# Patient Record
Sex: Female | Born: 1980 | Race: White | Hispanic: No | Marital: Married | Smoking: Former smoker
Health system: Southern US, Community
[De-identification: ages and names within clinical notes are randomized; demographics above are authoritative.]

## PROBLEM LIST (undated history)

## (undated) DIAGNOSIS — J40 Bronchitis, not specified as acute or chronic: Secondary | ICD-10-CM

## (undated) DIAGNOSIS — F329 Major depressive disorder, single episode, unspecified: Secondary | ICD-10-CM

## (undated) DIAGNOSIS — T7840XA Allergy, unspecified, initial encounter: Secondary | ICD-10-CM

## (undated) DIAGNOSIS — R011 Cardiac murmur, unspecified: Secondary | ICD-10-CM

## (undated) DIAGNOSIS — Z8639 Personal history of other endocrine, nutritional and metabolic disease: Secondary | ICD-10-CM

## (undated) DIAGNOSIS — F32A Depression, unspecified: Secondary | ICD-10-CM

## (undated) DIAGNOSIS — F419 Anxiety disorder, unspecified: Secondary | ICD-10-CM

## (undated) DIAGNOSIS — J189 Pneumonia, unspecified organism: Secondary | ICD-10-CM

## (undated) DIAGNOSIS — E041 Nontoxic single thyroid nodule: Secondary | ICD-10-CM

## (undated) HISTORY — DX: Allergy, unspecified, initial encounter: T78.40XA

## (undated) HISTORY — DX: Anxiety disorder, unspecified: F41.9

## (undated) HISTORY — DX: Depression, unspecified: F32.A

## (undated) HISTORY — DX: Major depressive disorder, single episode, unspecified: F32.9

## (undated) HISTORY — DX: Personal history of other endocrine, nutritional and metabolic disease: Z86.39

---

## 2005-10-28 ENCOUNTER — Other Ambulatory Visit: Admission: RE | Admit: 2005-10-28 | Discharge: 2005-10-28 | Payer: Self-pay | Admitting: Gynecology

## 2006-12-03 ENCOUNTER — Other Ambulatory Visit: Admission: RE | Admit: 2006-12-03 | Discharge: 2006-12-03 | Payer: Self-pay | Admitting: Gynecology

## 2007-10-12 ENCOUNTER — Ambulatory Visit (HOSPITAL_COMMUNITY): Admission: RE | Admit: 2007-10-12 | Discharge: 2007-10-12 | Payer: Self-pay | Admitting: Internal Medicine

## 2008-12-23 ENCOUNTER — Inpatient Hospital Stay (HOSPITAL_COMMUNITY): Admission: AD | Admit: 2008-12-23 | Discharge: 2008-12-25 | Payer: Self-pay | Admitting: Obstetrics and Gynecology

## 2008-12-26 ENCOUNTER — Encounter: Admission: RE | Admit: 2008-12-26 | Discharge: 2009-01-25 | Payer: Self-pay | Admitting: Obstetrics and Gynecology

## 2009-01-26 ENCOUNTER — Encounter: Admission: RE | Admit: 2009-01-26 | Discharge: 2009-02-24 | Payer: Self-pay | Admitting: Obstetrics and Gynecology

## 2009-03-20 ENCOUNTER — Emergency Department (HOSPITAL_COMMUNITY): Admission: EM | Admit: 2009-03-20 | Discharge: 2009-03-20 | Payer: Self-pay | Admitting: Emergency Medicine

## 2011-01-17 LAB — CBC
Hemoglobin: 12.6 g/dL (ref 12.0–15.0)
MCHC: 34.1 g/dL (ref 30.0–36.0)
MCV: 92.1 fL (ref 78.0–100.0)
MCV: 92.1 fL (ref 78.0–100.0)
Platelets: 162 10*3/uL (ref 150–400)
RDW: 13.1 % (ref 11.5–15.5)
RDW: 13.1 % (ref 11.5–15.5)
WBC: 13.5 10*3/uL — ABNORMAL HIGH (ref 4.0–10.5)

## 2011-01-17 LAB — RPR: RPR Ser Ql: NONREACTIVE

## 2011-02-19 NOTE — Discharge Summary (Signed)
Jennifer Ponce, Jennifer Ponce             ACCOUNT NO.:  [id number]   MEDICAL RECORD NO.:  [id number]          PATIENT TYPE:  INP   LOCATION:  9146                          FACILITY:  [facility]   PHYSICIAN:  Sherron Monday, MD        DATE OF BIRTH:  Aug 21, 1981   DATE OF ADMISSION:  12/23/2008  DATE OF DISCHARGE:  12/25/2008                               DISCHARGE SUMMARY   ADMITTING DIAGNOSIS:  Intrauterine pregnancy at term for induction of  labor.   DISCHARGE DIAGNOSIS:  Intrauterine pregnancy at term for induction of  labor, delivered via vacuum-assisted vaginal delivery.   For a complete history and physical, please refer to the dictated note.  However, in brief, a 30 year old G1, P0 at 53 plus weeks' pregnancy for  induction of labor given postdates.  She has had an uncomplicated  prenatal course.  She was admitted to Labor and Delivery on December 23, 2008.  Four hours after her penicillin was first dose, she had her  membranes ruptured for clear fluid and she also had Pitocin  augmentation.  She progressed to complete-complete +2, pushed for  approximately 20 minutes to deliver a viable female infant at 60 with  Apgars of 8 at one minute and 9 at five minutes and a weight of 7 pounds  13 ounces.  Placenta was expressed intact at 1813.  She had a first-  degree perineal that was somewhat stellate as well as bilateral labia  that were repaired with 3-0 Vicryl in a typical fashion.  The patient  delivered with vacuum assistance secondary to prolonged decels.  After  discussion of risks, benefits, and alternatives, and bladder was  sterilely drained, we proceeded with 4 pulls and no popoffs.  Her  postpartum course was relatively uncomplicated.  She remained afebrile  with stable vital signs throughout.  She tolerated p.o. and was  ambulating well.  She did have some numbness in her anterior thighs.  This was discussed with Anesthesia.  We will continue to monitor.  If  she continues to have  problems, we will discuss it further in her  postpartum course.  Her hemoglobin decreased from 12.6 to 10.5.  She is  discharged to home on postpartum day #2 with routine discharge  instructions and numbers to call if any questions or problems as well as  prescriptions for Motrin, Vicodin, and prenatal vitamins.  She will  follow up in 6 weeks.   DISCHARGE INFORMATION:  Hemoglobin 12.6 to 10.5.  O positive.  Rubella  equivocal.  We will start contraception at her postpartum checkup,  possibly an IUD versus Micronor.  She plans to breast-feed.  She voices  understanding to all these instructions and will return for her visit.      Sherron Monday, MD  Electronically Signed     JB/MEDQ  D:  12/25/2008  T:  12/26/2008  Job:  [id number]

## 2011-02-19 NOTE — H&P (Signed)
Jennifer Ponce, AUGUST             ACCOUNT NO.:  [id number]   MEDICAL RECORD NO.:  [id number]          PATIENT TYPE:  MAT   LOCATION:  [facility]                          [facility]ACILITY:  [facility]   PHYSICIAN:  Sherron Monday, MD        DATE OF BIRTH:  1981/06/06   DATE OF ADMISSION:  12/23/2008  DATE OF DISCHARGE:                              HISTORY & PHYSICAL   ADMISSION DIAGNOSIS:  Intrauterine pregnancy at term.   PROCEDURE PLANNED:  Induction of labor.   HPI:  A 29 year old G1, P0 at 40+ weeks pregnancy for induction of labor  given post dates.  She states she has had good fetal movement.  No loss  of fluid, no vaginal bleeding, and occasional contractions.  Her  prenatal care has been relatively uncomplicated.   PAST MEDICAL HISTORY:  Not significant.  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   PAST SURGICAL HISTORY:  Nonsignificant.   PAST OB/GYN HISTORY:  G1 is the current pregnancy.  No history of any  abnormal Pap smears or any sexually transmitted diseases.   MEDICATIONS:  Include prenatal vitamins.   ALLERGIES:  NO KNOWN DRUG ALLERGIES.   SOCIAL HISTORY:  Patient denies alcohol, tobacco, or drug use in the  pregnancy.  She does have a history of smoking but quit with the  pregnancy.   FAMILY HISTORY:  Patient denies coronary artery disease, hypertension,  diabetes, or cancer.   PHYSICAL EXAM:  GENERAL:  She is in no apparent distress.  CARDIOVASCULAR:  Regular rate and rhythm.  LUNGS:  Clear to auscultation bilaterally.  ABDOMEN:  Soft.  Fundus nontender.  EXTREMITIES:  Symmetric and nontender.   In the office, fetal heart tones were 140's and cervical exam was 2 to 3  cm dilated, 50% effaced, and -2 station with a vertex presentation.   PRENATAL LABS:  Hemoglobin 12.5, platelets 244,000.  RPR nonreactive.  Hepatitis B surface antigen negative.  Rubella equivocal.  HIV  nonreactive.  Urinalysis  negative.  Pap smear had no endocervical cells,  however, was otherwise within normal limits with a negative HPV.  Blood  type was O+.  Antibody screen negative.  Gonorrhea negative.  Chlamydia  negative.  Patient denied first and second trimester screening or cystic  fibrosis.  Anatomy ultrasound within normal limits.  Glucola of 82.  Group B strep was positive.  She did receive her seasonal flu shot, as  well as her H1N1 vaccine.  On ultrasound, she had an anterior placenta.   ASSESSMENT AND PLAN:  A 30 year old G1, P0 at 40+ weeks for induction of  labor given post dates.  She will be admitted and penicillin will be  dosed 4 hours after the penicillin.  Her water will be broke.  We expect  to have a vaginal delivery.  Discussed with her risks, benefits, and  alternatives of induction of labor.  She voices understanding to all  this.  We will proceed on the December 23, 2008.      Sherron Monday, MD  Electronically  Signed     JB/MEDQ  D:  12/22/2008  T:  12/22/2008  Job:  [id number]

## 2012-04-02 ENCOUNTER — Other Ambulatory Visit (HOSPITAL_COMMUNITY): Payer: Self-pay | Admitting: Internal Medicine

## 2012-04-02 ENCOUNTER — Ambulatory Visit (HOSPITAL_COMMUNITY)
Admission: RE | Admit: 2012-04-02 | Discharge: 2012-04-02 | Disposition: A | Discharge status: Home / Self Care | Payer: BC Managed Care – HMO | Source: Ambulatory Visit | Attending: Internal Medicine | Admitting: Internal Medicine

## 2012-04-02 DIAGNOSIS — R059 Cough, unspecified: Secondary | ICD-10-CM | POA: Insufficient documentation

## 2012-04-02 DIAGNOSIS — R0602 Shortness of breath: Secondary | ICD-10-CM | POA: Insufficient documentation

## 2012-04-02 DIAGNOSIS — J40 Bronchitis, not specified as acute or chronic: Secondary | ICD-10-CM | POA: Insufficient documentation

## 2012-04-02 DIAGNOSIS — R05 Cough: Secondary | ICD-10-CM | POA: Insufficient documentation

## 2013-06-08 ENCOUNTER — Other Ambulatory Visit (HOSPITAL_COMMUNITY): Payer: Self-pay | Admitting: Internal Medicine

## 2013-06-08 ENCOUNTER — Ambulatory Visit (HOSPITAL_COMMUNITY)
Admission: RE | Admit: 2013-06-08 | Discharge: 2013-06-08 | Disposition: A | Discharge status: Home / Self Care | Payer: BC Managed Care – HMO | Source: Ambulatory Visit | Attending: Internal Medicine | Admitting: Internal Medicine

## 2013-06-08 DIAGNOSIS — R0602 Shortness of breath: Secondary | ICD-10-CM

## 2013-06-15 ENCOUNTER — Institutional Professional Consult (permissible substitution): Payer: BC Managed Care – HMO | Admitting: Internal Medicine

## 2013-07-05 IMAGING — CR DG CHEST 2V
2 series · 2 of 2 positions shown · non-contrast
Comparison: 03/20/2009

CLINICAL DATA: Cough, bronchitis

CHEST - 2 VIEW

[view not recorded (1 of 2)]
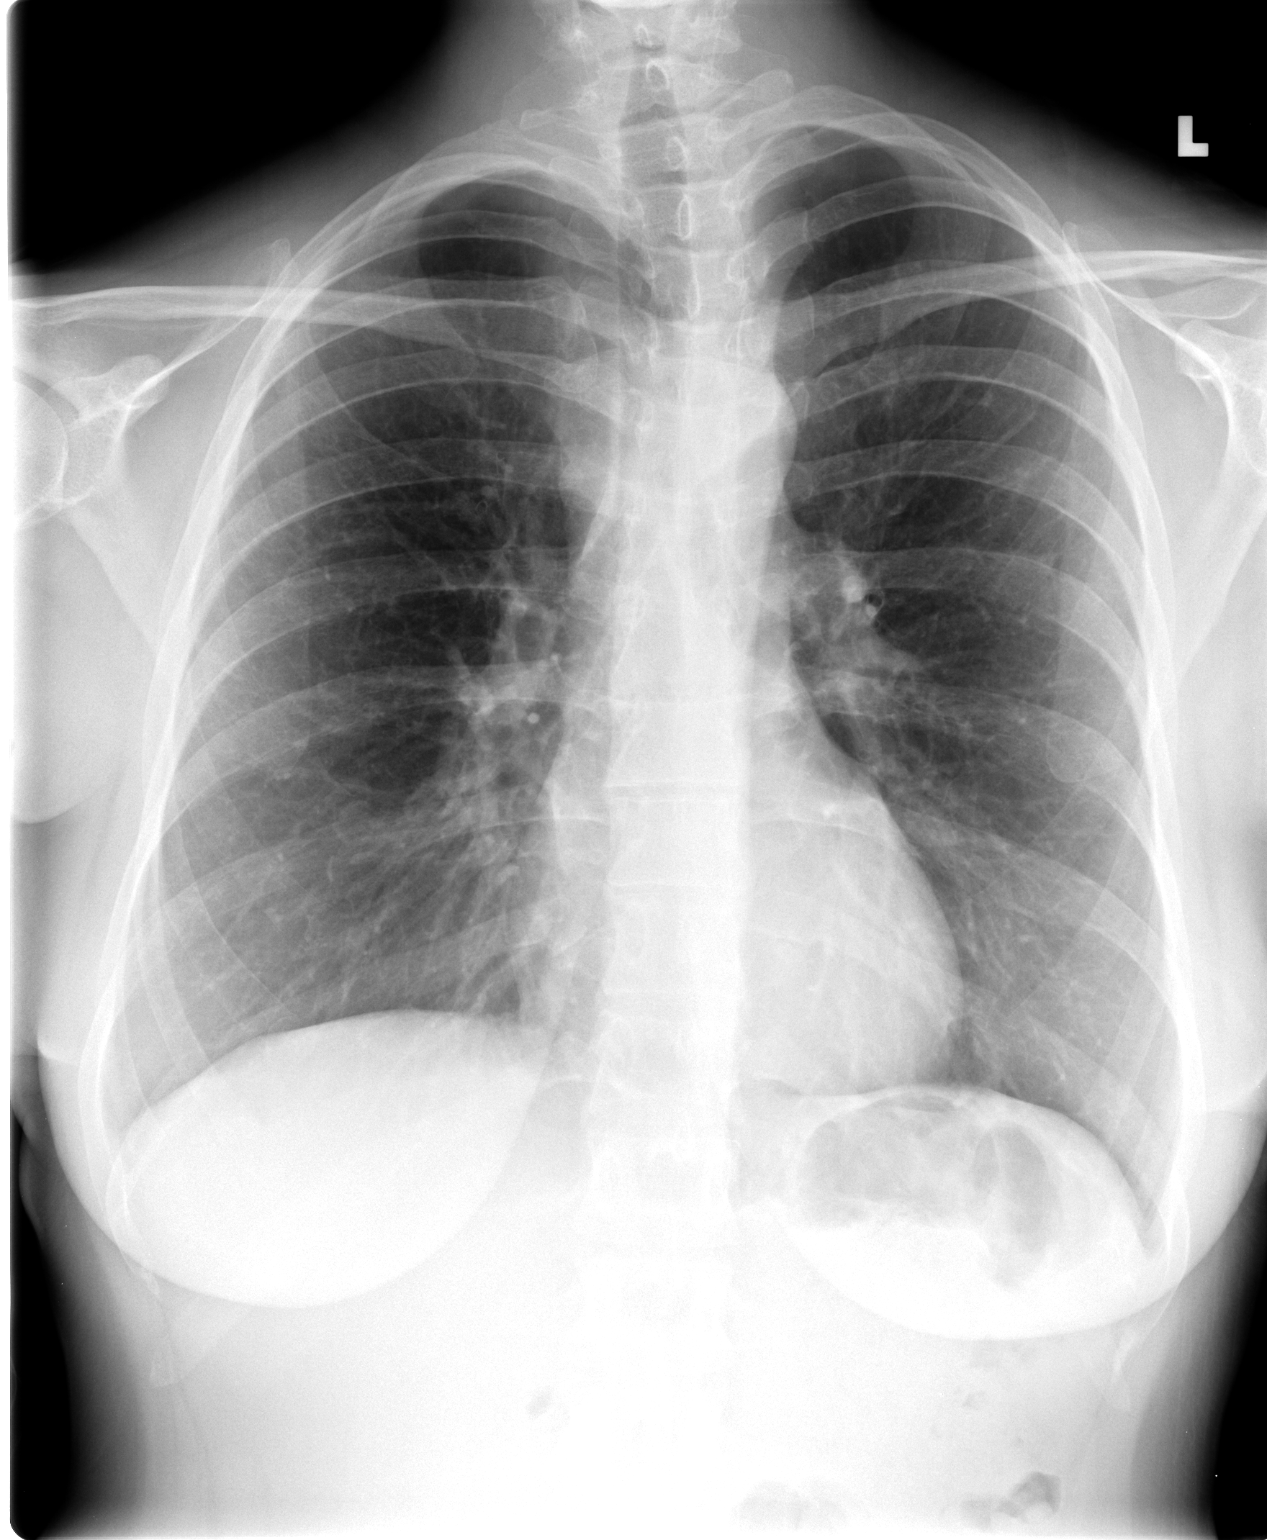

[view not recorded (2 of 2)]
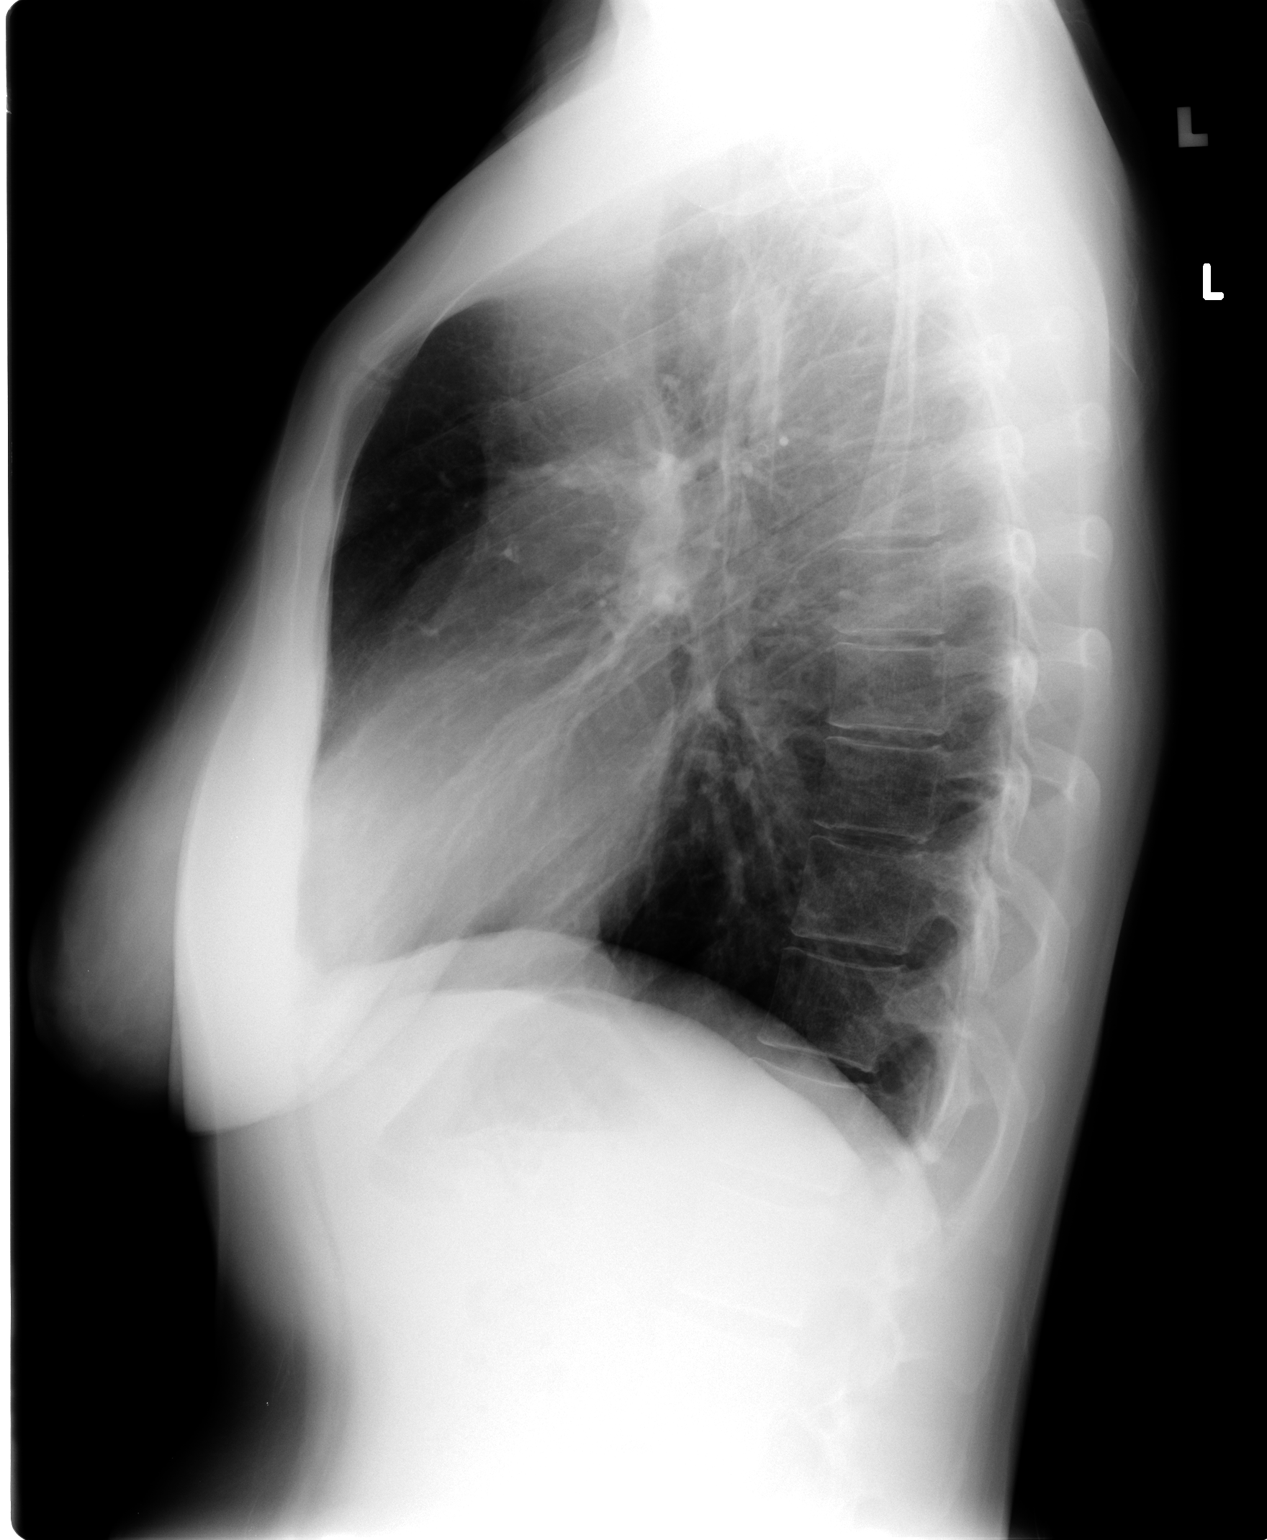

[2 of 2 positions shown; findings below may reference images not displayed]

FINDINGS: The heart size and mediastinal contours are within
normal limits.  Both lungs are clear.  The visualized skeletal
structures are unremarkable.
IMPRESSION: No active cardiopulmonary disease.

## 2013-07-26 ENCOUNTER — Ambulatory Visit (INDEPENDENT_AMBULATORY_CARE_PROVIDER_SITE_OTHER): Payer: BC Managed Care – HMO | Admitting: Internal Medicine

## 2013-07-26 ENCOUNTER — Encounter: Payer: Self-pay | Admitting: Internal Medicine

## 2013-07-26 VITALS — BP 112/76 | HR 71 | Ht 67.0 in | Wt 166.4 lb

## 2013-07-26 DIAGNOSIS — R0600 Dyspnea, unspecified: Secondary | ICD-10-CM

## 2013-07-26 DIAGNOSIS — J45909 Unspecified asthma, uncomplicated: Secondary | ICD-10-CM | POA: Insufficient documentation

## 2013-07-26 DIAGNOSIS — E041 Nontoxic single thyroid nodule: Secondary | ICD-10-CM

## 2013-07-26 DIAGNOSIS — R0609 Other forms of dyspnea: Secondary | ICD-10-CM

## 2013-07-26 MED ORDER — FLUTICASONE FUROATE-VILANTEROL 100-25 MCG/INH IN AEPB: 1.0000 | INHALATION_SPRAY | Freq: Every day | RESPIRATORY_TRACT | Status: DC

## 2013-07-26 NOTE — Assessment & Plan Note (Signed)
Compatible with mild asthma, but not clear if she responded to trial of Advair and prednisone. We will try again with a maintenance controller scheduling PFTs. Her symptoms are supple, and seen in pulmonary rather than muscle weakness. In the differential consider cardiomyopathy and anemia. Doubt PE.

## 2013-07-26 NOTE — Progress Notes (Signed)
07/26/13- 25 yoF former smoker referred courtesy of Dr Kathryne Sharper ; SOB started few months ago; harder in hotter weather. She had smoked one pack per day for 8 years, ending 6 years ago. Has been aware over the last 2 months of occasional difficulty taking a deep breath. At the onset she felt some knee cough congestion from her throat. At first dyspnea was intermittent but it became gradually more persistent. Worse with heat, humidity, exercise like brisk walking. No chest pain, fluid retention or palpitation. Not aware of postnasal drip or reflux. Generally comfortable lying down but may have had to sit up occasionally to get some relief at the very beginning. She gives a history of intermittent bronchitis and 2 episodes of walking pneumonia. Several sinus infections. Tends to wheeze with infection. Dr Kathryne Sharper office gave a trial of Advair, Singulair, prednisone taper in September. Not clear, that has helped. She denies family history of lung disease personal history of DVT, heart disease or significant anemia. She denies being pregnant.  Prior to Admission medications   Medication Sig Start Date End Date Taking? Authorizing Provider  aspirin 81 MG tablet Take 81 mg by mouth daily.   Yes Historical Provider, MD  Cholecalciferol (VITAMIN D3) 5000 UNITS CAPS Take 1 capsule by mouth daily.   Yes Historical Provider, MD  FLUoxetine (PROZAC) 20 MG capsule Take 20 mg by mouth daily.   Yes Historical Provider, MD  levonorgestrel (MIRENA) 20 MCG/24HR IUD 1 each by Intrauterine route once.   Yes Historical Provider, MD  Fluticasone Furoate-Vilanterol (BREO ELLIPTA) 100-25 MCG/INH AEPB Inhale 1 puff into the lungs daily. 07/26/13   Waymon Budge, MD   History reviewed. No pertinent past medical history. History reviewed. No pertinent past surgical history. Family History  Problem Relation Age of Onset  . Heart disease Maternal Grandmother   . Brain cancer Maternal Grandfather   . Breast cancer Maternal  Aunt    History   Social History  . Marital Status: Married    Spouse Name: N/A    Number of Children: 1  . Years of Education: N/A   Occupational History  . Airline pilot    Social History Main Topics  . Smoking status: Former Smoker -- 8 years    Types: Cigarettes    Quit date: 07/27/2007  . Smokeless tobacco: Not on file  . Alcohol Use: Yes     Comment: occasionally  . Drug Use: No  . Sexual Activity: Not on file   Other Topics Concern  . Not on file   Social History Narrative  . No narrative on file   ROS-see HPI Constitutional:   No-   weight loss, night sweats, fevers, chills, fatigue, lassitude. HEENT:   No-  headaches, difficulty swallowing, tooth/dental problems, sore throat,       No-  sneezing, itching, ear ache, nasal congestion, post nasal drip,  CV:  No-   chest pain, orthopnea, PND, swelling in lower extremities, anasarca, dizziness, palpitations Resp: +shortness of breath with exertion or at rest.              No-   productive cough,  No non-productive cough,  No- coughing up of blood.              No-   change in color of mucus.  No- wheezing.   Skin: No-   rash or lesions. GI:  No-   heartburn, indigestion, abdominal pain, nausea, vomiting, diarrhea,  change in bowel habits, loss of appetite GU: No-   dysuria, change in color of urine, no urgency or frequency.  No- flank pain. MS:  No-   joint pain or swelling.  No- decreased range of motion.  No- back pain. Neuro-     nothing unusual Psych:  No- change in mood or affect. No depression or anxiety.  No memory loss.  OBJ- Physical Exam General- Alert, Oriented, Affect-appropriate, Distress- none acute Skin- rash-none, lesions- none, excoriation- none Lymphadenopathy- none Head- atraumatic            Eyes- Gross vision intact, PERRLA, conjunctivae and secretions clear            Ears- Hearing, canals-normal            Nose- Clear, no-Septal dev, mucus, polyps, erosion,  perforation             Throat- Mallampati II , mucosa clear , drainage- none, tonsils- atrophic Neck- flexible , trachea midline, no stridor , thyroid+ question L thyroid nodule, carotid no bruit Chest - symmetrical excursion , unlabored           Heart/CV- RRR , no murmur , no gallop  , no rub, nl s1 s2                           - JVD- none , edema- none, stasis changes- none, varices- none           Lung- clear to P&A, wheeze- none, cough- none , dullness-none, rub- none           Chest wall-  Abd- tender-no, distended-no, bowel sounds-present, HSM- no Br/ Gen/ Rectal- Not done, not indicated Extrem- cyanosis- none, clubbing, none, atrophy- none, strength- nl Neuro- grossly intact to observation    CXR 06/08/13 FINDINGS:  The heart size and mediastinal contours are within normal limits.  Both lungs are clear. The visualized skeletal structures are  unremarkable.  IMPRESSION:  No active cardiopulmonary disease.  Electronically Signed  By: Myles Rosenthal  On: 06/08/2013 12:06

## 2013-07-26 NOTE — Patient Instructions (Addendum)
Sample x 2 Breo Ellipta  1 puff then rinse mouth, once daily- maintenance inhaler  Order- schedule PFT    Dx asthma  Please have Dr Kathryne Sharper office check your thyroid   Please call here after you finish the Breo samples, to let us know how you have done.

## 2013-07-26 NOTE — Assessment & Plan Note (Signed)
She will check this with her primary physician

## 2013-08-02 ENCOUNTER — Other Ambulatory Visit: Payer: Self-pay | Admitting: Internal Medicine

## 2013-08-02 DIAGNOSIS — R220 Localized swelling, mass and lump, head: Secondary | ICD-10-CM

## 2013-08-04 ENCOUNTER — Ambulatory Visit
Admission: RE | Admit: 2013-08-04 | Discharge: 2013-08-04 | Disposition: A | Discharge status: Home / Self Care | Payer: BC Managed Care – PPO | Source: Ambulatory Visit | Attending: Internal Medicine | Admitting: Internal Medicine

## 2013-08-04 DIAGNOSIS — R220 Localized swelling, mass and lump, head: Secondary | ICD-10-CM

## 2013-08-06 ENCOUNTER — Ambulatory Visit (INDEPENDENT_AMBULATORY_CARE_PROVIDER_SITE_OTHER): Payer: BC Managed Care – PPO | Admitting: Internal Medicine

## 2013-08-06 DIAGNOSIS — J45909 Unspecified asthma, uncomplicated: Secondary | ICD-10-CM

## 2013-08-06 LAB — PULMONARY FUNCTION TEST

## 2013-08-06 NOTE — Progress Notes (Signed)
PFT done today. 

## 2013-08-09 ENCOUNTER — Other Ambulatory Visit: Payer: Self-pay | Admitting: Internal Medicine

## 2013-08-09 DIAGNOSIS — E041 Nontoxic single thyroid nodule: Secondary | ICD-10-CM

## 2013-08-12 ENCOUNTER — Telehealth: Payer: Self-pay | Admitting: Internal Medicine

## 2013-08-12 NOTE — Telephone Encounter (Signed)
tammy called from [facility] to inform us on up coming appt for Jennifer Ponce sch on nov 12 at 3;45 for a thyroid biposy. Just wanted to let you know.

## 2013-08-12 NOTE — Telephone Encounter (Signed)
Pls make sure patient aware of appointment.

## 2013-08-13 ENCOUNTER — Encounter: Payer: Self-pay | Admitting: Internal Medicine

## 2013-08-16 ENCOUNTER — Ambulatory Visit: Payer: BC Managed Care – PPO | Admitting: Physician Assistant

## 2013-08-16 ENCOUNTER — Encounter: Payer: Self-pay | Admitting: Physician Assistant

## 2013-08-16 VITALS — BP 112/64 | HR 84 | Temp 98.1°F | Resp 16 | Ht 66.5 in | Wt 166.0 lb

## 2013-08-16 DIAGNOSIS — Z0000 Encounter for general adult medical examination without abnormal findings: Secondary | ICD-10-CM

## 2013-08-16 NOTE — Patient Instructions (Signed)

## 2013-08-16 NOTE — Progress Notes (Signed)
Complete Physical HPI Patient presents for complete physical.   Anxiety/depression on Prozac and states it is well controlled.  Vitamin D def on medications.  Patient had a new lump on her neck and has had U/S that shows a complex left thyroid cyst that she is getting a biopsy on Wednesday.  She has also had new shortness of breath which she has seen Dr. Maple Hudson. Has started in August, worse with heat, activity. Was intermittent but now everyday. Feels she can't get a deep breath. Before the breathing problems started she had a cough and her husband notices that she clears her throat.  Current Medications:    Medication List       This list is accurate as of: 08/16/13  2:35 PM.  Always use your most recent med list.               aspirin 81 MG tablet  Take 81 mg by mouth daily.     FLUoxetine 20 MG capsule  Commonly known as:  PROZAC  Take 20 mg by mouth daily.     Fluticasone Furoate-Vilanterol 100-25 MCG/INH Aepb  Commonly known as:  BREO ELLIPTA  Inhale 1 puff into the lungs daily.     levonorgestrel 20 MCG/24HR IUD  Commonly known as:  MIRENA  1 each by Intrauterine route once.     Vitamin D3 5000 UNITS Caps  Take 1 capsule by mouth daily.       Health Maintainance:  Health Maintenance  Topic Date Due  . Influenza Vaccine  05/07/2013  . Pap Smear  02/10/2014  . Tetanus/tdap  08/14/2015    Allergies:  Allergies  Allergen Reactions  . Albuterol     jitters   Medical History:  Past Medical History  Diagnosis Date  . Allergy   . Depression   . Anxiety    Surgical History: No past surgical history on file. Family History:  Family History  Problem Relation Age of Onset  . Heart disease Maternal Grandmother   . Brain cancer Maternal Grandfather   . Breast cancer Maternal Aunt    Social History:  History  Substance Use Topics  . Smoking status: Former Smoker -- 10 years    Types: Cigarettes    Quit date: 07/27/2007  . Smokeless tobacco: Never Used   . Alcohol Use: Yes     Comment: occasionally   ROS Constitutional: Denies weight loss/gain, headaches, insomnia, fatigue, night sweats, and change in appetite. Eyes: Denies redness, blurred vision, diplopia, discharge, itchy, watery eyes.  ENT: + thyroid mass Denies discharge, congestion, post nasal drip, sore throat, earache, hearing loss, dental pain, Tinnitus, Vertigo, Sinus pain, snoring.  Cardio: Denies chest pain, palpitations, irregular heartbeat, dyspnea, diaphoresis, orthopnea, PND, claudication, edema Respiratory:  See HPI denies cough, pleurisy, hoarseness, wheezing.  Gastrointestinal: Denies dysphagia, heartburn, pain, cramps, nausea, vomiting, bloating, diarrhea, constipation, hematemesis, melena, hematochezia, hemorrhoids Genitourinary: Denies dysuria, frequency, urgency, nocturia, hesitancy, discharge, hematuria, flank pain Breast:Breast lumps, nipple discharge, bleeding.  Musculoskeletal: Denies arthralgia, myalgia, stiffness, Jt. Swelling, pain, Skin: Denies puritis, rash, hives,  acne, eczema, changing in skin lesion Neuro: Weakness, tremor, incoordination, spasms, paresthesia, pain Psychiatric: Denies confusion, memory loss, sensory loss Endocrine: Denies change in weight, skin, hair change, nocturia, and paresthesia, Diabetic Polys, visual blurring, hyper /hypo glycemic episodes.  Heme/Lymph: Excessive bleeding, bruising, enlarged lymph nodes  Physical Exam: Estimated body mass index is 26.39 kg/(m^2) as calculated from the following:   Height as of this encounter: 5' 6.5" (1.689 m).  Weight as of this encounter: 166 lb (75.297 kg). Filed Vitals:   08/16/13 1420  BP: 112/64  Pulse: 84  Temp: 98.1 F (36.7 C)  Resp: 16   General Appearance: Well nourished, in no apparent distress. Eyes: PERRLA, EOMs, conjunctiva no swelling or erythema, normal fundi and vessels. Sinuses: No Frontal/maxillary tenderness ENT/Mouth: Ext aud canals clear, normal light reflex with  TMs without erythema, bulging.  Good dentition. No erythema, swelling, or exudate on post pharynx. Tonsils not swollen or erythematous. Hearing normal.  Neck: Supple, + thyroid mass on left lower lobe. No bruits Respiratory: Respiratory effort normal, BS equal bilaterally without rales, rhonci, wheezing or stridor. Cardio: Heart sounds normal, regular rate and rhythm without murmurs, rubs or gallops. Peripheral pulses brisk and equal bilaterally, without edema.  Chest: symmetric, with normal excursions and percussion. Breasts: defer OB/GYN Abdomen: Flat, soft, with bowl sounds. + epigastric tenderness, no guarding, rebound, hernias, masses, or organomegaly. .  Lymphatics: Non tender without + anterior cervical left lymphadenopathy approx. 1x1cm  Genitourinary: defer Musculoskeletal: Full ROM all peripheral extremities,5/5 strength, and normal gait. Skin: Warm, dry without rashes, lesions, ecchymosis.  Neuro: Cranial nerves intact, reflexes equal bilaterally. Normal muscle tone, no cerebellar symptoms. Sensation intact.  Pysch: Awake and oriented X 3, normal affect, Insight and Judgment appropriate.   EKG:  Normal  Assessment and Plan: SOB- EKG normal- with epigastric tenderness likely more GERD/asthma induced GERD. Will treat 2 weeks PPI and get Hpylori. And give handout on GERD diet.  Thyroid nodule/mass- check TSH, get BX Wednesday.  Depression/anxiety- continue prozac- in remission.  Vitamin D- continue medication General health maintainence     Quentin Mulling 2:35 PM

## 2013-08-17 ENCOUNTER — Telehealth: Payer: Self-pay | Admitting: Internal Medicine

## 2013-08-17 LAB — URINALYSIS, ROUTINE W REFLEX MICROSCOPIC
Bilirubin Urine: NEGATIVE
Glucose, UA: NEGATIVE mg/dL
Specific Gravity, Urine: 1.025 (ref 1.005–1.030)
pH: 6.5 (ref 5.0–8.0)

## 2013-08-17 LAB — LIPID PANEL
Cholesterol: 125 mg/dL (ref 0–200)
LDL Cholesterol: 57 mg/dL (ref 0–99)
Total CHOL/HDL Ratio: 2.3 Ratio
Triglycerides: 67 mg/dL (ref <150)
VLDL: 13 mg/dL (ref 0–40)

## 2013-08-17 LAB — MICROALBUMIN / CREATININE URINE RATIO
Creatinine, Urine: 207.1 mg/dL
Microalb Creat Ratio: 11.3 mg/g (ref 0.0–30.0)
Microalb, Ur: 2.33 mg/dL — ABNORMAL HIGH (ref 0.00–1.89)

## 2013-08-17 LAB — HEMOGLOBIN A1C: Mean Plasma Glucose: 105 mg/dL (ref <117)

## 2013-08-17 NOTE — Telephone Encounter (Signed)
Pt is requesting PFT results. Please advise. Carron Curie, CMA

## 2013-08-18 ENCOUNTER — Ambulatory Visit
Admission: RE | Admit: 2013-08-18 | Discharge: 2013-08-18 | Disposition: A | Discharge status: Home / Self Care | Payer: BC Managed Care – PPO | Source: Ambulatory Visit | Attending: Internal Medicine | Admitting: Internal Medicine

## 2013-08-18 ENCOUNTER — Other Ambulatory Visit (HOSPITAL_COMMUNITY)
Admission: RE | Admit: 2013-08-18 | Discharge: 2013-08-18 | Disposition: A | Discharge status: Home / Self Care | Payer: BC Managed Care – PPO | Source: Ambulatory Visit | Attending: Interventional Radiology | Admitting: Interventional Radiology

## 2013-08-18 DIAGNOSIS — E041 Nontoxic single thyroid nodule: Secondary | ICD-10-CM

## 2013-08-18 NOTE — Telephone Encounter (Signed)
Spoke with pt.  Informed her of below PFT results per CY.  She verbalized understanding Pt states the Quentin Mulling, [place] with Dr. Kathryne Sharper office took her off of the [facility] because she has some other things going on.  Reports, when she was on the [place], she didn't notice much difference.  She would like to know if she needs to do anything further at this time.  Dr. Maple Hudson, pls advise.  Thank you.

## 2013-08-18 NOTE — Telephone Encounter (Signed)
Ok to watch and see how she does off Breo.

## 2013-08-18 NOTE — Telephone Encounter (Signed)
lmomtcb x1 for pt 

## 2013-08-18 NOTE — Telephone Encounter (Signed)
Pulmonary function test scores were within normal ranges, with some further improvement after the bronchodilator medicine

## 2013-08-18 NOTE — Telephone Encounter (Signed)
LMTCB

## 2013-08-20 LAB — HELICOBACTER PYLORI ABS-IGG+IGA, BLD
H Pylori IgG: <0.4 {ISR}
HELICOBACTER PYLORI AB, IGA: 7.8 U/mL (ref <9.0)

## 2013-08-20 NOTE — Telephone Encounter (Signed)
Pt advised. Jennifer Castillo, CMA  

## 2013-08-23 ENCOUNTER — Other Ambulatory Visit: Payer: Self-pay | Admitting: Physician Assistant

## 2013-08-23 DIAGNOSIS — E041 Nontoxic single thyroid nodule: Secondary | ICD-10-CM

## 2013-09-06 ENCOUNTER — Ambulatory Visit (INDEPENDENT_AMBULATORY_CARE_PROVIDER_SITE_OTHER): Payer: BC Managed Care – PPO | Admitting: Internal Medicine

## 2013-09-06 ENCOUNTER — Encounter (INDEPENDENT_AMBULATORY_CARE_PROVIDER_SITE_OTHER): Payer: Self-pay

## 2013-09-06 ENCOUNTER — Encounter: Payer: Self-pay | Admitting: Internal Medicine

## 2013-09-06 VITALS — BP 110/66 | HR 78 | Ht 67.0 in | Wt 168.0 lb

## 2013-09-06 DIAGNOSIS — R0600 Dyspnea, unspecified: Secondary | ICD-10-CM

## 2013-09-06 DIAGNOSIS — R0609 Other forms of dyspnea: Secondary | ICD-10-CM

## 2013-09-06 MED ORDER — ALBUTEROL SULFATE HFA 108 (90 BASE) MCG/ACT IN AERS: 2.0000 | INHALATION_SPRAY | Freq: Four times a day (QID) | RESPIRATORY_TRACT | Status: DC | PRN

## 2013-09-06 NOTE — Patient Instructions (Addendum)
Script for rescue inhaler albuterol HFA bronchodilator to use if needed, 2 puffs up to 4 times daily, IF NEEDED  Please call as needed

## 2013-09-06 NOTE — Progress Notes (Signed)
07/26/13- 26 yoF former smoker referred courtesy of Dr Kathryne Sharper ; SOB started few months ago; harder in hotter weather. She had smoked one pack per day for 8 years, ending 6 years ago. Has been aware over the last 2 months of occasional difficulty taking a deep breath. At the onset she felt some knee cough congestion from her throat. At first dyspnea was intermittent but it became gradually more persistent. Worse with heat, humidity, exercise like brisk walking. No chest pain, fluid retention or palpitation. Not aware of postnasal drip or reflux. Generally comfortable lying down but may have had to sit up occasionally to get some relief at the very beginning. She gives a history of intermittent bronchitis and 2 episodes of walking pneumonia. Several sinus infections. Tends to wheeze with infection. Dr Kathryne Sharper office gave a trial of Advair, Singulair, prednisone taper in September. Not clear, that has helped. She denies family history of lung disease personal history of DVT, heart disease or significant anemia. She denies being pregnant.  09/06/13- 75 yoF former smoker followed for asthma, dyspnea, recurrent sinusitis, bronchitis FOLLOWS FOR: breathing is about the same; pt aware that PFT results were normal. Knot/lump still on neck-not cancer-will see general surgeon. Had thyroid bx. Shortness of breath is worse in warm weather or when she is very active. Denies cough or wheeze, swelling or leg pain. Has used a sample [employer]. CXR 06/08/13 IMPRESSION:  No active cardiopulmonary disease.  Electronically Signed  By: Myles Rosenthal  On: 06/08/2013 12:06 PFT: 08/06/2013-within normal limits. FEV1 4.03/121%, FEV1/FVC 0.85, FEF 25-75% 4.38/126% with a 27% response to bronchodilator. TLC 107%, DLCO 107%.  ROS-see HPI Constitutional:   No-   weight loss, night sweats, fevers, chills, fatigue, lassitude. HEENT:   No-  headaches, difficulty swallowing, tooth/dental problems, sore throat,       No-   sneezing, itching, ear ache, nasal congestion, post nasal drip,  CV:  No-   chest pain, orthopnea, PND, swelling in lower extremities, anasarca, dizziness, palpitations Resp: +shortness of breath with exertion or at rest.              No-   productive cough,  No non-productive cough,  No- coughing up of blood.              No-   change in color of mucus.  No- wheezing.   Skin: No-   rash or lesions. GI:  No-   heartburn, indigestion, abdominal pain, nausea, vomiting,  GU:  MS:  No-   joint pain or swelling.   Neuro-     nothing unusual Psych:  No- change in mood or affect. No depression or anxiety.  No memory loss.  OBJ- Physical Exam General- Alert, Oriented, Affect-appropriate, Distress- none acute Skin- rash-none, lesions- none, excoriation- none Lymphadenopathy- none Head- atraumatic            Eyes- Gross vision intact, PERRLA, conjunctivae and secretions clear            Ears- Hearing, canals-normal            Nose- Clear, no-Septal dev, mucus, polyps, erosion, perforation             Throat- Mallampati II , mucosa clear , drainage- none, tonsils- atrophic Neck- flexible , trachea midline, no stridor , + left anterior neck mass moves with thyroid, carotid no bruit Chest - symmetrical excursion , unlabored           Heart/CV- RRR , no murmur , no  gallop  , no rub, nl s1 s2                           - JVD- none , edema- none, stasis changes- none, varices- none           Lung- clear to P&A, wheeze- none, cough- none , dullness-none, rub- none, unlabored           Chest wall-  Abd-  Br/ Gen/ Rectal- Not done, not indicated Extrem- cyanosis- none, clubbing, none, atrophy- none, strength- nl Neuro- grossly intact to observation

## 2013-09-17 ENCOUNTER — Encounter (INDEPENDENT_AMBULATORY_CARE_PROVIDER_SITE_OTHER): Payer: Self-pay

## 2013-09-17 ENCOUNTER — Ambulatory Visit (INDEPENDENT_AMBULATORY_CARE_PROVIDER_SITE_OTHER): Payer: BC Managed Care – PPO | Admitting: Surgery

## 2013-09-17 ENCOUNTER — Encounter (INDEPENDENT_AMBULATORY_CARE_PROVIDER_SITE_OTHER): Payer: Self-pay | Admitting: Surgery

## 2013-09-17 VITALS — BP 120/80 | HR 78 | Resp 12 | Ht 66.0 in | Wt 168.2 lb

## 2013-09-17 DIAGNOSIS — E041 Nontoxic single thyroid nodule: Secondary | ICD-10-CM

## 2013-09-17 NOTE — Progress Notes (Signed)
Chief Complaint:  Left thyroid nodule  History of Present Illness:  Jennifer Ponce is an 32 y.o. female whose history may have started last spring when she went on Prozac. In the summer she began having shortness of breath and a perception of unable to get a good breath in he seemed winded. This led to a workup by Fannie Knee which did not show any evidence of pulmonary compromise. As a part of his workup he found a left thyroid nodule that was pretty large. Since then she has had this aspirated and this is a benign cytology. I reviewed the ultrasound and is slightly complex cystic and solid mass. On physical exam it is readily palpable on the left side was not hard or fixed.  I think she would be reasonable for her awoke come off the Prozac and see if that made any difference in her sensation of breathing. One of the serious consequences of Prozac his pulmonary fibrosis although I'm not suggesting that that is what is going on. I don't think as her thyroid is given at this feeling but think that he should be observed and if it doesn't get smaller or certainly gets bigger it may be some thing that needs to be removed with a left thyroid lobectomy. I have given her the information regarding left thyroid lobectomy.    Past Medical History  Diagnosis Date  . Allergy   . Depression   . Anxiety     No past surgical history on file.  Current Outpatient Prescriptions  Medication Sig Dispense Refill  . albuterol (PROVENTIL HFA;VENTOLIN HFA) 108 (90 BASE) MCG/ACT inhaler Inhale 2 puffs into the lungs every 6 (six) hours as needed for wheezing or shortness of breath.  1 Inhaler  6  . aspirin 81 MG tablet Take 81 mg by mouth daily.      . Cholecalciferol (VITAMIN D3) 5000 UNITS CAPS Take 1 capsule by mouth daily.      Marland Kitchen FLUoxetine (PROZAC) 20 MG capsule Take 20 mg by mouth daily.      Marland Kitchen levonorgestrel (MIRENA) 20 MCG/24HR IUD 1 each by Intrauterine route once.       No current facility-administered  medications for this visit.   Albuterol Family History  Problem Relation Age of Onset  . Heart disease Maternal Grandmother   . Brain cancer Maternal Grandfather   . Breast cancer Maternal Aunt    Social History:   reports that she quit smoking about 6 years ago. Her smoking use included Cigarettes. She smoked 0.00 packs per day for 10 years. She has never used smokeless tobacco. She reports that she drinks alcohol. She reports that she does not use illicit drugs.   REVIEW OF SYSTEMS - PERTINENT POSITIVES ONLY: As above  Physical Exam:   Blood pressure 120/80, pulse 78, resp. rate 12, height 5\' 6"  (1.676 m), weight 168 lb 3.2 oz (76.295 kg). Body mass index is 27.16 kg/(m^2).  Gen:  WDWN WF NAD  Neurological: Alert and oriented to person, place, and time. Motor and sensory function is grossly intact  Head: Normocephalic and atraumatic.  Eyes: Conjunctivae are normal. Pupils are equal, round, and reactive to light. No scleral icterus.  Neck: Normal range of motion. Neck supple. No tracheal deviation there is an easily palpable left thyroid mass that is soft, not fixed.  No adenopathy Cardiovascular:  SR without murmurs or gallops.  No carotid bruits Respiratory: Effort normal.  No respiratory distress. No chest wall tenderness. Breath sounds normal.  No wheezes, rales or rhonchi.  Abdomen:  nontender GU: Musculoskeletal: Normal range of motion. Extremities are nontender. No cyanosis, edema or clubbing noted Lymphadenopathy: No cervical, preauricular, postauricular or axillary adenopathy is present Skin: Skin is warm and dry. No rash noted. No diaphoresis. No erythema. No pallor. Pscyh: Normal mood and affect. Behavior is normal. Judgment and thought content normal.   LABORATORY RESULTS: No results found for this or any previous visit (from the past 48 hour(s)).  RADIOLOGY RESULTS: No results found.  Problem List: Patient Active Problem List   Diagnosis Date Noted  . Dyspnea  07/26/2013  . Left thyroid nodule 07/26/2013    Assessment & Plan: Left thyroid mass.  Will observe over the next 2 months.  Suggest stopping Prozac.   Return 2 months.     Matt B. Daphine Deutscher, MD, [facility] [phone] beeper [phone]  09/17/2013 5:26 PM

## 2013-09-17 NOTE — Patient Instructions (Signed)
Thyroidectomy °Thyroidectomy is the removal of part or all of your thyroid gland. Your thyroid gland is a butterfly-shaped gland at the base of your neck. It produces a substance called thyroid hormone, which regulates the physical and chemical processes that keep your body functioning and make energy available to your body (metabolism). °The amount of thyroid gland tissue that is removed during a thyroidectomy depends on the reason for the procedure. Typically, if only a part of your gland is removed, enough thyroid gland tissue remains to maintain normal function. If your entire thyroid gland is removed or if the amount of thyroid gland tissue remaining is inadequate to maintain normal function, you will need life-long treatment with thyroid hormone on a daily basis. °Thyroidectomy maybe performed when you have the following conditions: °· Thyroid nodules. These are small, abnormal collections of tissue that form inside the thyroid gland. If these nodules begin to enlarge at a rapid rate, a sample of tissue from the nodule is taken through a needle and examined (needle biopsy). This is done to determine if the nodules are cancerous. Depending on the outcome of this exam, thyroidectomy may be necessary. °· Thyroid cancer. °· Goiter, which is an enlarged thyroid gland. All or part of the thyroid gland may be removed if the gland has become so large that it causes difficulty breathing or swallowing. °· Hyperthyroidism. This is when the thyroid gland produces too much thyroid hormone. Hypothyroidism can cause symptoms of fluctuating weight, intolerance to heat, irritability, shortness of breath, and chest pain. °LET YOUR CAREGIVER KNOW ABOUT:  °· Allergies to food or medicine. °· Medicines that you are taking, including vitamins, herbs, eyedrops, over-the-counter medicines, and creams. °· Previous problems you have had with anesthetics or numbing medicines. °· History of bleeding problems or blood clots. °· Previous  surgeries you have had. °· Other health problems, including diabetes and kidney problems, you have had. °· Possibility of pregnancy, if this applies. °BEFORE THE PROCEDURE  °· Do not eat or drink anything, including water, for at least 6 hours before the procedure. °· Ask your caregiver whether you should stop taking certain medicines before the day of the procedure. °PROCEDURE  °There are different ways that thyroidectomy is performed. For each type, you will be given a medicine to make you sleep (general anesthetic). The three main types of thyroidectomy are listed as follows: °· Conventional thyroidectomy A cut (incision) in the center portion of your lower neck is made with a scalpel. Muscles below your skin are separated to gain access to your thyroid gland. Your thyroid gland is dissected from your windpipe (trachea). Often a drain is placed at the incision site to drain any blood that accumulates under the skin after the procedure. This drain will be removed before you go home. The wound from the incision should heal within 2 weeks. °· Endoscopic thyroidectomy Small incisions are made in your lower neck. A small instrument (endoscope) is inserted under your skin at the incision sites. The endoscope used for thyroidectomy consists of 2 flexible tubes. Inside one of the tubes is a video camera that is used to guide the surgeon. Tools to remove the thyroid gland, including a tool to cut the gland (dissectors) and a suction device, are inserted through the other tube. The surgeon uses the dissectors to dissect the thyroid gland from the trachea and remove it. °· Robotic thyroidectomy This procedure allows your thyroid gland to be removed through incisions in your armpit, your chest, or high in your   neck. Instruments similar to endoscopes provide a 3-dimensional picture of the surgical site. Dissecting instruments are controlled by devices similar to joysticks. These devices allow more accurate manipulation of the  instruments. After the blood supply to the gland is removed, the gland is cut into several pieces and removed through the incisions. °RISKS AND COMPLICATIONS °Complications associated with thyroidectomy are rare, but they can occur. Possible complications include: °· A decrease in parathyroid hormone levels (hypoparathyroidism) Your parathyroid glands are located close behind your thyroid gland. They are responsible for maintaining calcium levels inthe body. If they are damaged or removed, levels of calcium in the blood become low and nerves become irritable, which can cause muscle spasms. Medicines are available to treat this. °· Bacterial infection This can often be treated with medicines that kill bacteria (antibiotics). °· Damage to your voice box nerves This could cause hoarseness or complete loss of voice. °· Bleeding or airway obstruction. °AFTER THE PROCEDURE  °· You will rest in the recovery room as you wake up. °· When you first wake up, your throat may feel slightly sore. °· You will not be allowed to eat or drink until instructed otherwise. °· You will be taken to your hospital room. You will usually stay at the hospital for 1 or 2 nights. °· If a drain is placed during the procedure, it usually is removed the next day. °· You may have some mild neck pain. °· Your voice may be weak. This usually is temporary. °Document Released: 03/19/2001 Document Revised: 01/18/2013 Document Reviewed: 12/26/2010 °Exit[employer]® Patient Information ©2014 [employer]. ° °

## 2013-09-22 ENCOUNTER — Encounter: Payer: Self-pay | Admitting: Internal Medicine

## 2013-09-22 NOTE — Assessment & Plan Note (Addendum)
PFT: 08/06/2013-within normal limits. FEV1 4.03/121%, FEV1/FVC 0.85, FEF 25-75% 4.38/126% with a 27% response to bronchodilator. TLC 107%, DLCO 107%. Clear chest x-ray and normal exam. Baseline normal small airway flows did respond some to bronchodilator so we can let her work with a bronchodilator to assess symptom response. This may be best managed as a mild intermittent asthma without complication.

## 2013-10-26 ENCOUNTER — Encounter (INDEPENDENT_AMBULATORY_CARE_PROVIDER_SITE_OTHER): Payer: Self-pay | Admitting: Surgery

## 2013-11-26 ENCOUNTER — Encounter: Payer: Self-pay | Admitting: Emergency Medicine

## 2013-11-26 ENCOUNTER — Ambulatory Visit (INDEPENDENT_AMBULATORY_CARE_PROVIDER_SITE_OTHER): Payer: BC Managed Care – PPO | Admitting: Emergency Medicine

## 2013-11-26 VITALS — BP 104/62 | HR 72 | Temp 98.2°F | Resp 18 | Ht 66.5 in | Wt 168.0 lb

## 2013-11-26 DIAGNOSIS — R05 Cough: Secondary | ICD-10-CM

## 2013-11-26 DIAGNOSIS — R059 Cough, unspecified: Secondary | ICD-10-CM

## 2013-11-26 DIAGNOSIS — J309 Allergic rhinitis, unspecified: Secondary | ICD-10-CM

## 2013-11-26 DIAGNOSIS — E041 Nontoxic single thyroid nodule: Secondary | ICD-10-CM

## 2013-11-26 MED ORDER — AZITHROMYCIN 250 MG PO TABS: ORAL_TABLET | ORAL | Status: AC

## 2013-11-26 MED ORDER — BENZONATATE 100 MG PO CAPS: 100.0000 mg | ORAL_CAPSULE | Freq: Three times a day (TID) | ORAL | Status: DC | PRN

## 2013-11-26 MED ORDER — PREDNISONE 10 MG PO TABS: ORAL_TABLET | ORAL | Status: DC

## 2013-11-26 NOTE — Progress Notes (Signed)
Subjective:    Patient ID: Jennifer Ponce, female    DOB: Jan 17, 1981, 33 y.o.   MRN: [id number]  HPI Comments: 33 yo female with increasing allergy drainage for over 1 week and now with productive cough x  1 day. Production dark thick brown. Nasal congestion clear. She notes both ears feel full. She notes throat was sore but has improved. She has not tried any OTC  She has HX of thyroid nodule with BX benign. She is concerned because still has obvious visible fullness in neck on the left and occasional feels difficulty with breathing in upper airway.   Sinusitis Associated symptoms include congestion, coughing and ear pain.  Sore Throat  Associated symptoms include congestion, coughing and ear pain.  Otalgia  Associated symptoms include coughing.   Current Outpatient Prescriptions on File Prior to Visit  Medication Sig Dispense Refill  . albuterol (PROVENTIL HFA;VENTOLIN HFA) 108 (90 BASE) MCG/ACT inhaler Inhale 2 puffs into the lungs every 6 (six) hours as needed for wheezing or shortness of breath.  1 Inhaler  6  . aspirin 81 MG tablet Take 81 mg by mouth daily.      . Cholecalciferol (VITAMIN D3) 5000 UNITS CAPS Take 1 capsule by mouth daily.      Marland Kitchen. FLUoxetine (PROZAC) 20 MG capsule Take 20 mg by mouth daily.      Marland Kitchen. levonorgestrel (MIRENA) 20 MCG/24HR IUD 1 each by Intrauterine route once.       No current facility-administered medications on file prior to visit.   Allergies  Allergen Reactions  . Albuterol     jitters   Past Medical History  Diagnosis Date  . Allergy   . Depression   . Anxiety       Review of Systems  HENT: Positive for congestion and ear pain.   Respiratory: Positive for cough.   All other systems reviewed and are negative.   BP 104/62  Pulse 72  Temp(Src) 98.2 F (36.8 C) (Temporal)  Resp 18  Ht 5' 6.5" (1.689 m)  Wt 168 lb (76.204 kg)  BMI 26.71 kg/m2  LMP 11/05/2013     Objective:   Physical Exam  Nursing note and vitals  reviewed. Constitutional: She is oriented to person, place, and time. She appears well-developed and well-nourished. No distress.  HENT:  Head: Normocephalic and atraumatic.  Right Ear: External ear normal.  Left Ear: External ear normal.  Nose: Nose normal.  Mouth/Throat: Oropharynx is clear and moist. No oropharyngeal exudate.  Eyes: Conjunctivae and EOM are normal.  Neck: Normal range of motion. Neck supple. No JVD present. Thyromegaly present.  Left anterior fullness visible.   Cardiovascular: Normal rate, regular rhythm, normal heart sounds and intact distal pulses.   Pulmonary/Chest: Effort normal.  Tight/ congested cough   Abdominal: Soft. Bowel sounds are normal. She exhibits no distension and no mass. There is no tenderness. There is no rebound and no guarding.  Musculoskeletal: Normal range of motion. She exhibits no edema and no tenderness.  Lymphadenopathy:    She has no cervical adenopathy.  Neurological: She is alert and oriented to person, place, and time. No cranial nerve deficit.  Skin: Skin is warm and dry. No rash noted. No erythema. No pallor.  Psychiatric: She has a normal mood and affect. Her behavior is normal. Judgment and thought content normal.          Assessment & Plan:  1. Cough/ Allergic rhinitis- Allegra OTC, increase H2o, allergy hygiene explained.Tessalon perles 100mg   AD, Use Albuterol inhaler BID, Pred Dp 10 mG AD, Zpak if symptoms increase 2. Thyroid nodule HX and Anterior fullness- Ref Endocrine for Evaluation

## 2013-11-26 NOTE — Patient Instructions (Signed)
FYI Pneumonia, Adult Pneumonia is an infection of the lungs. It may be caused by a germ (virus or bacteria). Some types of pneumonia can spread easily from person to person. This can happen when you cough or sneeze. HOME CARE  Only take medicine as told by your doctor.  Take your medicine (antibiotics) as told. Finish it even if you start to feel better.  Do not smoke.  You may use a vaporizer or humidifier in your room. This can help loosen thick spit (mucus).  Sleep so you are almost sitting up (semi-upright). This helps reduce coughing.  Rest. A shot (vaccine) can help prevent pneumonia. Shots are often advised for:  People over 35 years old.  Patients on chemotherapy.  People with long-term (chronic) lung problems.  People with immune system problems. GET HELP RIGHT AWAY IF:   You are getting worse.  You cannot control your cough, and you are losing sleep.  You cough up blood.  Your pain gets worse, even with medicine.  You have a fever.  Any of your problems are getting worse, not better.  You have shortness of breath or chest pain. MAKE SURE YOU:   Understand these instructions.  Will watch your condition.  Will get help right away if you are not doing well or get worse. Document Released: 03/11/2008 Document Revised: 12/16/2011 Document Reviewed: 12/14/2010 [facility] Patient Information 2014 [place], [place]. Allergic Rhinitis Add Allegra and Flonase Allergic rhinitis is when the mucous membranes in the nose respond to allergens. Allergens are particles in the air that cause your body to have an allergic reaction. This causes you to release allergic antibodies. Through a chain of events, these eventually cause you to release histamine into the blood stream. Although meant to protect the body, it is this release of histamine that causes your discomfort, such as frequent sneezing, congestion, and an itchy, runny nose.  CAUSES  Seasonal allergic rhinitis (hay  fever) is caused by pollen allergens that may come from grasses, trees, and weeds. Year-round allergic rhinitis (perennial allergic rhinitis) is caused by allergens such as house dust mites, pet dander, and mold spores.  SYMPTOMS   Nasal stuffiness (congestion).  Itchy, runny nose with sneezing and tearing of the eyes. DIAGNOSIS  Your health care provider can help you determine the allergen or allergens that trigger your symptoms. If you and your health care provider are unable to determine the allergen, skin or blood testing may be used. TREATMENT  Allergic Rhinitis does not have a cure, but it can be controlled by:  Medicines and allergy shots (immunotherapy).  Avoiding the allergen. Hay fever may often be treated with antihistamines in pill or nasal spray forms. Antihistamines block the effects of histamine. There are over-the-counter medicines that may help with nasal congestion and swelling around the eyes. Check with your health care provider before taking or giving this medicine.  If avoiding the allergen or the medicine prescribed do not work, there are many new medicines your health care provider can prescribe. Stronger medicine may be used if initial measures are ineffective. Desensitizing injections can be used if medicine and avoidance does not work. Desensitization is when a patient is given ongoing shots until the body becomes less sensitive to the allergen. Make sure you follow up with your health care provider if problems continue. HOME CARE INSTRUCTIONS It is not possible to completely avoid allergens, but you can reduce your symptoms by taking steps to limit your exposure to them. It helps to know exactly  what you are allergic to so that you can avoid your specific triggers. SEEK MEDICAL CARE IF:   You have a fever.  You develop a cough that does not stop easily (persistent).  You have shortness of breath.  You start wheezing.  Symptoms interfere with normal daily  activities. Document Released: 06/18/2001 Document Revised: 07/14/2013 Document Reviewed: 05/31/2013 [facility] Patient Information 2014 [place].

## 2013-11-29 ENCOUNTER — Telehealth: Payer: Self-pay | Admitting: *Deleted

## 2013-11-29 ENCOUNTER — Other Ambulatory Visit: Payer: Self-pay | Admitting: Emergency Medicine

## 2013-11-29 NOTE — Telephone Encounter (Signed)
Please resend tesslon pearls to [facility]. Pt got 2 meds but this wasn't there for her to pick up. Just asking if we can send it again.

## 2013-11-29 NOTE — Telephone Encounter (Signed)
Please advise patient to double check to see if the pharmacy is out of the Tessalon perles because it was confirmed receipt by pharmacy on 2/20

## 2013-12-08 ENCOUNTER — Ambulatory Visit (INDEPENDENT_AMBULATORY_CARE_PROVIDER_SITE_OTHER): Payer: BC Managed Care – PPO | Admitting: Emergency Medicine

## 2013-12-08 ENCOUNTER — Encounter: Payer: Self-pay | Admitting: Emergency Medicine

## 2013-12-08 VITALS — BP 118/64 | HR 62 | Temp 98.2°F | Resp 18 | Ht 66.5 in | Wt 165.0 lb

## 2013-12-08 DIAGNOSIS — M542 Cervicalgia: Secondary | ICD-10-CM

## 2013-12-08 MED ORDER — PREDNISONE 10 MG PO TABS: ORAL_TABLET | ORAL | Status: DC

## 2013-12-08 MED ORDER — CYCLOBENZAPRINE HCL 5 MG PO TABS: 5.0000 mg | ORAL_TABLET | Freq: Three times a day (TID) | ORAL | Status: DC | PRN

## 2013-12-08 NOTE — Patient Instructions (Signed)
Degenerative Disk Disease Degenerative disk disease is a condition caused by the changes that occur in the cushions of the backbone (spinal disks) as you grow older. Spinal disks are soft and compressible disks located between the bones of the spine (vertebrae). They act like shock absorbers. Degenerative disk disease can affect the whole spine. However, the neck and lower back are most commonly affected. Many changes can occur in the spinal disks with aging, such as:  The spinal disks may dry and shrink.  Small tears may occur in the tough, outer covering of the disk (annulus).  The disk space may become smaller due to loss of water.  Abnormal growths in the bone (spurs) may occur. This can put pressure on the nerve roots exiting the spinal canal, causing pain.  The spinal canal may become narrowed. CAUSES  Degenerative disk disease is a condition caused by the changes that occur in the spinal disks with aging. The exact cause is not known, but there is a genetic basis for many patients. Degenerative changes can occur due to loss of fluid in the disk. This makes the disk thinner and reduces the space between the backbones. Small cracks can develop in the outer layer of the disk. This can lead to the breakdown of the disk. You are more likely to get degenerative disk disease if you are overweight. Smoking cigarettes and doing heavy work such as weightlifting can also increase your risk of this condition. Degenerative changes can start after a sudden injury. Growth of bone spurs can compress the nerve roots and cause pain.  SYMPTOMS  The symptoms vary from person to person. Some people may have no pain, while others have severe pain. The pain may be so severe that it can limit your activities. The location of the pain depends on the part of your backbone that is affected. You will have neck or arm pain if a disk in the neck area is affected. You will have pain in your back, buttocks, or legs if a disk  in the lower back is affected. The pain becomes worse while bending, reaching up, or with twisting movements. The pain may start gradually and then get worse as time passes. It may also start after a major or minor injury. You may feel numbness or tingling in the arms or legs.  DIAGNOSIS  Your caregiver will ask you about your symptoms and about activities or habits that may cause the pain. He or she may also ask about any injuries, diseases, or treatments you have had earlier. Your caregiver will examine you to check for the range of movement that is possible in the affected area, to check for strength in your extremities, and to check for sensation in the areas of the arms and legs supplied by different nerve roots. An X-ray of the spine may be taken. Your caregiver may suggest other imaging tests, such as magnetic resonance imaging (MRI), if needed.  TREATMENT  Treatment includes rest, modifying your activities, and applying ice and heat. Your caregiver may prescribe medicines to reduce your pain and may ask you to do some exercises to strengthen your back. In some cases, you may need surgery. You and your caregiver will decide on the treatment that is best for you. HOME CARE INSTRUCTIONS   Follow proper lifting and walking techniques as advised by your caregiver.  Maintain good posture.  Exercise regularly as advised.  Perform relaxation exercises.  Change your sitting, standing, and sleeping habits as advised. Change positions   frequently.  Lose weight as advised.  Stop smoking if you smoke.  Wear supportive footwear. SEEK MEDICAL CARE IF:  Your pain does not go away within 1 to 4 weeks. SEEK IMMEDIATE MEDICAL CARE IF:   Your pain is severe.  You notice weakness in your arms, hands, or legs.  You begin to lose control of your bladder or bowel movements. MAKE SURE YOU:   Understand these instructions.  Will watch your condition.  Will get help right away if you are not doing  well or get worse. Document Released: 07/21/2007 Document Revised: 12/16/2011 Document Reviewed: 07/21/2007 [facility] Patient Information 2014 [place], [place]. FYI Bacterial Meningitis Meningitis is an inflammation of the fluid and membranes surrounding the spinal cord and brain. It is sometimes referred to as spinal meningitis. Bacterial meningitis is caused by a bacterial infection. It is important to know whether a virus or bacterium is causing your meningitis. This is because the severity of illness and the treatment differ. Viral meningitis is generally less severe and can go away without specific treatment. Bacterial meningitis is treated with antibiotics. Meningitis can also be caused by fungi, parasites, certain medicines, cancer, and autoimmune disorders. People with lowered immune systems are at greater risk for poor outcomes from meningitis. It is important to get treatment as soon as possible to minimize the impact of a meningitis infection. Long-term complications could include seizures, hearing loss, weakness, paralysis, blindness, or cognitive impairment. CAUSES Bacterial meningitis occurs when certain bacteria reach the membranes covering the brain. These bacteria may enter the bloodstream and travel to the brain. They may also reach the brain directly through the nasal cavity or through an open wound in the skull. It is important to know which type of bacteria is causing the meningitis. Antibiotics can prevent some types of meningitis from spreading and infecting other people. The types of bacteria that commonly cause meningitis vary by age group. Common causes by age group are listed below.  Premature babies and newborns up to 3 months  Group B streptococcus, which exists in the vaginal tract.  Escherichia coli, which exists in the digestive tract.  Listeria monocytogenes.  Older children  Neisseria meningitidis, often refered to as meningococcus.  Streptococcus  pneumoniae.  Haemophilus influenzae type B, in children under 17 years old.  Adults  Neisseria meningitidis.  Streptococcus pneumoniae.  Listeria monocytogenes, in adults over 1 years old. People who have a cerebral shunt, cochlear implant, or any similar device are at increased risk of infection. Rarely, an infection in the head and neck area (such as otitis media or mastoiditis) can lead to meningitis. Tuberculous meningitis is caused by an infection with Mycobacterium tuberculosis. This type of meningitis is more common in countries where tuberculosis is common. It may also occur in people with immune system problems, such as AIDS.  SYMPTOMS  Symptoms can develop over several hours. They may even take 1 to 2 days to develop. Common symptoms of meningitis in people over the age of 2 years include:  High fever.  Headache.  Stiff neck.  Irritability.  Nausea and vomiting.  Decreased appetite.  Fatigue.  Discomfort from exposure to light.  Discomfort from exposure to loud noise.  Trouble walking.  Altered mental status.  Seizures. A rash may occur in severe cases of meningitis. Meningitis caused by the bacterium Neisseria meningitidis (known as "meningococcal meningitis") may cause a rapidly spreading rash, which appears before other symptoms. The rash consists of many small, irregular purple or red spots (petechiae) on the  trunk, lower extremities, mucous membranes, conjunctiva of the eye, and sometimes on the palms of the hands or soles of the feet. DIAGNOSIS  Early diagnosis and treatment are very important. If you have symptoms, you should see your caregiver right away. Your evaluation may include lab tests of your blood and spinal fluid. A spinal fluid sample is taken through a procedure called a lumbar puncture or spinal tap. During the procedure, a needle is inserted into an area in the lower back. The fluid is examined and cultures are done. The type of bacteria causing  the infection will be identified. You may also have a CT scan of your brain as part of your evaluation.If there is suspicion of an infection or inflammation of the brain (encephalitis), an MRI scan may be done. PREVENTION Bacterial meningitis is contagious. The bacteria spread from person to person in secretions from the lungs and throat. This may happen when you cough, sneeze, or kiss someone. None of the bacteria that cause meningitis are as contagious as the common cold or the flu. The bacteria are not spread by simply breathing the same air as a person who has meningitis. The bacteria that cause meningitis can spread through close or prolonged contact with a patient who has certain types of bacterial meningitis. People who are at high risk should receive antibiotics to help prevent them from getting the disease. People at increased risk of getting the infection include:  Health care workers.  People who share a household with the patient.  Day-care center workers.  Anyone who has direct contact with the patient's oral secretions. Some forms of meningitis (such as those associated with meningococcus,  Haemophilus influenzae type B, pneumococci, or mumps virus infections) may be prevented by immunization. Talk to your caregiver about specific vaccines that are recommended for adults and children.  TREATMENT  Bacterial meningitis can be treated with many antibiotics. Treatment must be started early in the course of the disease. Antibiotics must be started immediately, even before the exact cause is determined. You will be given specific antibiotics once the exact cause is known. Steroids may also be used to limit swelling of the brain. HOME CARE INSTRUCTIONS   Rest.  Drink enough water and fluids to keep your urine clear or pale yellow.  Take all medicine as prescribed.  Practice good hygiene to prevent others from getting sick.  Follow up with your caregiver as directed. SEEK IMMEDIATE  MEDICAL CARE IF:   You develop a high fever, neck stiffness, or confusion.  You have a headache that becomes severe or does not respond to pain medicine.  You develop dizziness.  You have a fast heartbeat.  You have a seizure.  You develop a rash.  You are taking antibiotics and are not getting better.  You develop severe vomiting or are unable to tolerate food or fluids.  You have any worsening symptoms. MAKE SURE YOU:   Understand these instructions.  Will watch your condition.  Will get help right away if you are not doing well or get worse. Document Released: 12/02/2001 Document Revised: 12/16/2011 Document Reviewed: 01/09/2011 [facility] Patient Information 2014 [place].

## 2013-12-08 NOTE — Progress Notes (Signed)
   Subjective:    Patient ID: Jennifer Ponce, female    DOB: 19-Apr-1981, 33 y.o.   MRN: [id number]  HPI Comments: 33 yo female with upper back pain that has happened 2 prior episode this year. She denies injury/ strain with this episode. She denies relief with ice/ heat/ tylenol. Increased symptoms today with burning.  She had negative CXR 06/2013. She denies recent illness or fever. She notes pain started mid back and has now radiated up in to neck and is causing mild headache. She notes it hurts or feels tight to move neck.   Back Pain   Current Outpatient Prescriptions on File Prior to Visit  Medication Sig Dispense Refill  . albuterol (PROVENTIL HFA;VENTOLIN HFA) 108 (90 BASE) MCG/ACT inhaler Inhale 2 puffs into the lungs every 6 (six) hours as needed for wheezing or shortness of breath.  1 Inhaler  6  . aspirin 81 MG tablet Take 81 mg by mouth daily.      . Cholecalciferol (VITAMIN D3) 5000 UNITS CAPS Take 1 capsule by mouth daily.      Marland Kitchen. FLUoxetine (PROZAC) 20 MG capsule Take 20 mg by mouth daily.      Marland Kitchen. levonorgestrel (MIRENA) 20 MCG/24HR IUD 1 each by Intrauterine route once.       No current facility-administered medications on file prior to visit.   Allergies  Allergen Reactions  . Albuterol     jitters   Past Medical History  Diagnosis Date  . Allergy   . Depression   . Anxiety       Review of Systems  Musculoskeletal: Positive for back pain and neck pain.  All other systems reviewed and are negative.   BP 118/64  Pulse 62  Temp(Src) 98.2 F (36.8 C) (Temporal)  Resp 18  Ht 5' 6.5" (1.689 m)  Wt 165 lb (74.844 kg)  BMI 26.24 kg/m2  LMP 12/04/2013     Objective:   Physical Exam  Nursing note and vitals reviewed. Constitutional: She is oriented to person, place, and time. She appears well-developed and well-nourished.  HENT:  Head: Normocephalic and atraumatic.  Right Ear: External ear normal.  Left Ear: External ear normal.  Nose: Nose normal.   Mouth/Throat: No oropharyngeal exudate.  Eyes: Conjunctivae and EOM are normal.  Neck: Normal range of motion.  Cardiovascular: Normal rate, regular rhythm, normal heart sounds and intact distal pulses.   Pulmonary/Chest: Effort normal and breath sounds normal.  Abdominal: Soft.  Musculoskeletal: Normal range of motion.  + discomfort with full ROM  Lymphadenopathy:    She has no cervical adenopathy.  Neurological: She is alert and oriented to person, place, and time. No cranial nerve deficit. She exhibits normal muscle tone. Coordination normal.  Skin: Skin is warm and dry.  Psychiatric: She has a normal mood and affect. Judgment normal.          Assessment & Plan:  Neck/ back pain- Pred Dp 10 mg AD, Flexeril AD. Heat/ stretch/ ICE.May need refer to [facility] or PT if symptoms continue.  Advised probably not Meningitis but ER if any SX increase.

## 2013-12-17 ENCOUNTER — Encounter: Payer: Self-pay | Admitting: *Deleted

## 2014-01-03 ENCOUNTER — Ambulatory Visit (INDEPENDENT_AMBULATORY_CARE_PROVIDER_SITE_OTHER): Payer: BC Managed Care – PPO | Admitting: Internal Medicine

## 2014-01-03 ENCOUNTER — Encounter: Payer: Self-pay | Admitting: Internal Medicine

## 2014-01-03 VITALS — BP 104/62 | HR 71 | Temp 98.2°F | Resp 12 | Ht 66.25 in | Wt 169.0 lb

## 2014-01-03 DIAGNOSIS — E041 Nontoxic single thyroid nodule: Secondary | ICD-10-CM

## 2014-01-03 NOTE — Patient Instructions (Signed)
Please return in 1 year.  Please let me know if you decide for lobectomy (hemithyroidectomy).

## 2014-01-03 NOTE — Progress Notes (Signed)
Patient ID: Jennifer Ponce, female   DOB: [birth year], 33 y.o.   MRN: [id number]   HPI  Jennifer Ponce is a 33 y.o.-year-old female, referred by her PCP, Dr. Oneta RackMckeown, for evaluation for a large L thyroid nodule.  Pt noticed in 05/2013 that she had SOB with activity and heat. She was referred to Pulmonary >> PFTs normal (including flow-loop curve) >> found a thyroid nodule by palpation >> thyroid U/S (08/04/2013): large L complex thyroid nodule 3.9 x 2.3 x 2.8 cm. FNA (08/18/2013): benign. She saw surgery, but was told her neck compression sxs likely not from nodule.   Pt can feel her L nodule in neck (and this is also clearly visible when pt talks and moved her head), no hoarseness, no dysphagia/odynophagia, some SOB with lying down, but mostly with exertion, movement.  I reviewed pt's thyroid tests: Lab Results  Component Value Date   TSH 0.745 08/16/2013    Pt denies: - heat intolerance/cold intolerance - tremors - palpitations - anxiety/depression - hyperdefecation/constipation - weight loss - weight gain - dry skin - hair falling - problems with concentration - fatigue  Pt does not have a FH of thyroid ds. No FH of thyroid cancer. No h/o radiation tx to head or neck.  No seaweed or kelp, no recent contrast studies. No recent steroid use. No herbal supplements.   She also has a history of heart murmur since birth.   ROS: Constitutional: no weight gain/loss, no fatigue, no subjective hyperthermia/hypothermia, + poor sleep Eyes: no blurry vision, no xerophthalmia ENT: no sore throat, no nodules palpated in throat, no dysphagia/odynophagia, + some hoarseness Cardiovascular: no CP/SOB/palpitations/leg swelling Respiratory: no cough/+ SOB - see HPI Gastrointestinal: no N/V/D/C Musculoskeletal: no muscle/joint aches Skin: no rashes Neurological: no tremors/numbness/tingling/dizziness Psychiatric: + depression/no anxiety  Past Medical History  Diagnosis Date  . Allergy    . Depression   . Anxiety    History reviewed. No pertinent past surgical history. History   Social History  . Marital Status: Married    Spouse Name: N/A    Number of Children: 1   Occupational History  . Airline pilotservice operations manager    Social History Main Topics  . Smoking status: Former Smoker -- 10 years    Types: Cigarettes    Quit date: 07/27/2007  . Smokeless tobacco: Never Used  . Alcohol Use: Yes     Comment: occasionally  . Drug Use: No   Current Outpatient Prescriptions on File Prior to Visit  Medication Sig Dispense Refill  . albuterol (PROVENTIL HFA;VENTOLIN HFA) 108 (90 BASE) MCG/ACT inhaler Inhale 2 puffs into the lungs every 6 (six) hours as needed for wheezing or shortness of breath.  1 Inhaler  6  . aspirin 81 MG tablet Take 81 mg by mouth daily.      . Cholecalciferol (VITAMIN D3) 5000 UNITS CAPS Take 1 capsule by mouth daily.      Marland Kitchen. FLUoxetine (PROZAC) 20 MG capsule Take 20 mg by mouth daily.      Marland Kitchen. levonorgestrel (MIRENA) 20 MCG/24HR IUD 1 each by Intrauterine route once.      . cyclobenzaprine (FLEXERIL) 5 MG tablet Take 1 tablet (5 mg total) by mouth every 8 (eight) hours as needed for muscle spasms.  30 tablet  1  . predniSONE (DELTASONE) 10 MG tablet 1 po TID x 3 days, 1 PO BID x 3 days, 1 po QD x 5 days  20 tablet  0   No current facility-administered medications  on file prior to visit.   Allergies  Allergen Reactions  . Albuterol     jitters   Family History  Problem Relation Age of Onset  . Heart disease Maternal Grandmother   . Brain cancer Maternal Grandfather   . Breast cancer Maternal Aunt    PE: BP 104/62  Pulse 71  Temp(Src) 98.2 F (36.8 C) (Oral)  Resp 12  Ht 5' 6.25" (1.683 m)  Wt 169 lb (76.658 kg)  BMI 27.06 kg/m2  SpO2 98%  LMP 12/28/2013 Wt Readings from Last 3 Encounters:  01/03/14 169 lb (76.658 kg)  12/08/13 165 lb (74.844 kg)  11/26/13 168 lb (76.204 kg)   Constitutional: normal weight, in NAD Eyes: PERRLA,  EOMI, no exophthalmos ENT: moist mucous membranes, + thyromegaly and palpable L thyroid nodule, movable with deglutition, no cervical lymphadenopathy Cardiovascular: RRR, No MRG Respiratory: CTA B Gastrointestinal: abdomen soft, NT, ND, BS+ Musculoskeletal: no deformities, strength intact in all 4;  Skin: moist, warm, no rashes Neurological: no tremor with outstretched hands, DTR normal in all 4  ASSESSMENT: 1. Large L thyroid nodule - thyroid U/S (07/2013):   Right thyroid lobe: 5.4 x 1.4 x 2.2 cm. The parenchyma of the right lobe is homogeneous.   Left thyroid lobe: 6.4 x 2.4 x 3.0 cm. A complex largely solid nodule in the left mid lower lobe of thyroid measures 3.9 x 2.3 x 2.8 cm.   Isthmus: 3 mm in thickness. No nodules visualized.   Lymphadenopathy: none - FNA (08/2013): benign  PLAN: 1.  - I reviewed the images of her thyroid ultrasound along with the patient and her husband. I pointed out that the nodule is very large, but we know it is not cancerous. - she does not complain of dysphagia, so no benefit from a Ba swallow. She c/o SOB from the nodule, but the flow-loop curve is normal, and I believe that she has the SOB only in certain position or with swallowing, as the nodule is very mobile. I do not think she would benefit from a CT scan now, however, I d/w her that the surgeon may decide for it if she is to have thyroidectomy. I explained what this entails. I did explain that, while thyroid surgery is not a complicated one, it still can have side effects and also she might have a risk of ~25% of becoming hypothyroid after hemithyroidectomy.  - she will let me know what she decides about the surgery - I'll see her back in a year, unless she decides for hemithyroidectomy, in which case, I will see her 1 mo after the Sx  Pt decided to get a second opinion about her thyroid nodule.

## 2014-01-09 ENCOUNTER — Encounter: Payer: Self-pay | Admitting: Emergency Medicine

## 2014-01-10 ENCOUNTER — Other Ambulatory Visit: Payer: Self-pay | Admitting: Emergency Medicine

## 2014-01-10 DIAGNOSIS — E041 Nontoxic single thyroid nodule: Secondary | ICD-10-CM

## 2014-02-16 ENCOUNTER — Encounter: Payer: Self-pay | Admitting: Emergency Medicine

## 2014-02-17 MED ORDER — FLUOXETINE HCL 20 MG PO CAPS: 20.0000 mg | ORAL_CAPSULE | Freq: Every day | ORAL | Status: DC

## 2014-06-21 ENCOUNTER — Other Ambulatory Visit: Payer: Self-pay | Admitting: *Deleted

## 2014-06-21 MED ORDER — AMOXICILLIN 500 MG PO CAPS: ORAL_CAPSULE | ORAL | Status: DC

## 2014-07-20 ENCOUNTER — Encounter: Payer: Self-pay | Admitting: Physician Assistant

## 2014-07-20 ENCOUNTER — Other Ambulatory Visit: Payer: Self-pay | Admitting: Physician Assistant

## 2014-07-20 MED ORDER — AZITHROMYCIN 250 MG PO TABS: ORAL_TABLET | ORAL | Status: AC

## 2014-07-20 MED ORDER — PREDNISONE 10 MG PO TABS: ORAL_TABLET | ORAL | Status: DC

## 2014-08-18 ENCOUNTER — Encounter: Payer: Self-pay | Admitting: Physician Assistant

## 2014-10-19 ENCOUNTER — Ambulatory Visit (INDEPENDENT_AMBULATORY_CARE_PROVIDER_SITE_OTHER): Payer: BLUE CROSS/BLUE SHIELD | Admitting: Physician Assistant

## 2014-10-19 ENCOUNTER — Encounter: Payer: Self-pay | Admitting: Physician Assistant

## 2014-10-19 VITALS — BP 116/64 | HR 58 | Temp 98.2°F | Resp 16 | Ht 66.5 in | Wt 172.0 lb

## 2014-10-19 DIAGNOSIS — M62830 Muscle spasm of back: Secondary | ICD-10-CM

## 2014-10-19 DIAGNOSIS — E041 Nontoxic single thyroid nodule: Secondary | ICD-10-CM | POA: Insufficient documentation

## 2014-10-19 DIAGNOSIS — J029 Acute pharyngitis, unspecified: Secondary | ICD-10-CM

## 2014-10-19 DIAGNOSIS — F3341 Major depressive disorder, recurrent, in partial remission: Secondary | ICD-10-CM | POA: Insufficient documentation

## 2014-10-19 MED ORDER — CYCLOBENZAPRINE HCL 5 MG PO TABS: 5.0000 mg | ORAL_TABLET | Freq: Three times a day (TID) | ORAL | Status: DC | PRN

## 2014-10-19 MED ORDER — DEXAMETHASONE SODIUM PHOSPHATE 100 MG/10ML IJ SOLN
10.0000 mg | Freq: Once | INTRAMUSCULAR | Status: AC
  Administered 2014-10-19: 10 mg via INTRAMUSCULAR

## 2014-10-19 MED ORDER — AZITHROMYCIN 250 MG PO TABS: ORAL_TABLET | ORAL | Status: AC

## 2014-10-19 MED ORDER — PROMETHAZINE-CODEINE 6.25-10 MG/5ML PO SYRP: 5.0000 mL | ORAL_SOLUTION | Freq: Four times a day (QID) | ORAL | Status: AC | PRN

## 2014-10-19 NOTE — Patient Instructions (Signed)
-Take Z-Pak as prescribed -Take Promethazine-codeine as prescribed for cough -continue Albuterol inhaler. -Take Flexeril for muscle spasms.  Do NOT take while on cough syrup.  If you are not feeling better 10-14 days, then please call the office.  Pharyngitis Pharyngitis is redness, pain, and swelling (inflammation) of your pharynx.  CAUSES  Pharyngitis is usually caused by infection. Most of the time, these infections are from viruses (viral) and are part of a cold. However, sometimes pharyngitis is caused by bacteria (bacterial). Pharyngitis can also be caused by allergies. Viral pharyngitis may be spread from person to person by coughing, sneezing, and personal items or utensils (cups, forks, spoons, toothbrushes). Bacterial pharyngitis may be spread from person to person by more intimate contact, such as kissing.  SIGNS AND SYMPTOMS  Symptoms of pharyngitis include:   Sore throat.   Tiredness (fatigue).   Low-grade fever.   Headache.  Joint pain and muscle aches.  Skin rashes.  Swollen lymph nodes.  Plaque-like film on throat or tonsils (often seen with bacterial pharyngitis). DIAGNOSIS  Your health care provider will ask you questions about your illness and your symptoms. Your medical history, along with a physical exam, is often all that is needed to diagnose pharyngitis. Sometimes, a rapid strep test is done. Other lab tests may also be done, depending on the suspected cause.  TREATMENT  Viral pharyngitis will usually get better in 3-4 days without the use of medicine. Bacterial pharyngitis is treated with medicines that kill germs (antibiotics).  HOME CARE INSTRUCTIONS   Drink enough water and fluids to keep your urine clear or pale yellow.   Only take over-the-counter or prescription medicines as directed by your health care provider:   If you are prescribed antibiotics, make sure you finish them even if you start to feel better.   Do not take aspirin.    Get lots of rest.   Gargle with 8 oz of salt water ( tsp of salt per 1 qt of water) as often as every 1-2 hours to soothe your throat.   Throat lozenges (if you are not at risk for choking) or sprays may be used to soothe your throat. SEEK MEDICAL CARE IF:   You have large, tender lumps in your neck.  You have a rash.  You cough up green, yellow-brown, or bloody spit. SEEK IMMEDIATE MEDICAL CARE IF:   Your neck becomes stiff.  You drool or are unable to swallow liquids.  You vomit or are unable to keep medicines or liquids down.  You have severe pain that does not go away with the use of recommended medicines.  You have trouble breathing (not caused by a stuffy nose). MAKE SURE YOU:   Understand these instructions.  Will watch your condition.  Will get help right away if you are not doing well or get worse. Document Released: 09/23/2005 Document Revised: 07/14/2013 Document Reviewed: 05/31/2013 [facility] Patient Information 2015 [place]. This information is not intended to replace advice given to you by your health care provider. Make sure you discuss any questions you have with your health care provider.  Muscle Cramps and Spasms Muscle cramps and spasms occur when a muscle or muscles tighten and you have no control over this tightening (involuntary muscle contraction). They are a common problem and can develop in any muscle. The most common place is in the calf muscles of the leg. Both muscle cramps and muscle spasms are involuntary muscle contractions, but they also have differences:   Muscle cramps  are sporadic and painful. They may last a few seconds to a quarter of an hour. Muscle cramps are often more forceful and last longer than muscle spasms.  Muscle spasms may or may not be painful. They may also last just a few seconds or much longer. CAUSES  It is uncommon for cramps or spasms to be due to a serious underlying problem. In many cases, the cause of  cramps or spasms is unknown. Some common causes are:   Overexertion.   Overuse from repetitive motions (doing the same thing over and over).   Remaining in a certain position for a long period of time.   Improper preparation, form, or technique while performing a sport or activity.   Dehydration.   Injury.   Side effects of some medicines.   Abnormally low levels of the salts and ions in your blood (electrolytes), especially potassium and calcium. This could happen if you are taking water pills (diuretics) or you are pregnant.  Some underlying medical problems can make it more likely to develop cramps or spasms. These include, but are not limited to:   Diabetes.   Parkinson disease.   Hormone disorders, such as thyroid problems.   Alcohol abuse.   Diseases specific to muscles, joints, and bones.   Blood vessel disease where not enough blood is getting to the muscles.  HOME CARE INSTRUCTIONS   Stay well hydrated. Drink enough water and fluids to keep your urine clear or pale yellow.  It may be helpful to massage, stretch, and relax the affected muscle.  For tight or tense muscles, use a warm towel, heating pad, or hot shower water directed to the affected area.  If you are sore or have pain after a cramp or spasm, applying ice to the affected area may relieve discomfort.  Put ice in a plastic bag.  Place a towel between your skin and the bag.  Leave the ice on for 15-20 minutes, 03-04 times a day.  Medicines used to treat a known cause of cramps or spasms may help reduce their frequency or severity. Only take over-the-counter or prescription medicines as directed by your caregiver. SEEK MEDICAL CARE IF:  Your cramps or spasms get more severe, more frequent, or do not improve over time.  MAKE SURE YOU:   Understand these instructions.  Will watch your condition.  Will get help right away if you are not doing well or get worse. Document Released:  03/15/2002 Document Revised: 01/18/2013 Document Reviewed: 09/09/2012 [facility] Patient Information 2015 [place], [place]. This information is not intended to replace advice given to you by your health care provider. Make sure you discuss any questions you have with your health care provider.

## 2014-10-19 NOTE — Progress Notes (Signed)
HPI  A Caucasian 34 y.o.female presents to the office today due to cough, sore throat and fatigue for 1 week.  Patient states the sxs are getting worse.  Her husband has been sick at home too with cough that lasted for weeks.  She has tried NyQuil OTC and inhaler with no relief.  Patient has had trouble sleeping due to cough getting worse when lying down at night.  Patient is a former smoker and quit in October 2008.  Used to smoke 1/2 ppd and started age age 24.  Patient reports chills, ear congestion, rhinorrhea, cough with yellow mucus occasionally, SOB occasionally, wheezing, nausea and lightheadedness.  Patient denies fever, sweats, sinus pressure, trouble swallowing, chest tightness, CP, abdominal pain, vomiting, diarrhea constipation, headaches and dizziness.  Patient was also wondering about getting a refill on Flexeril for right shoulder/back muscle spasms.  She states there is a "knot" there that will not go away and moderate pain.  She states the flexeril helps with the spasms and pain and she only takes if needed.    Review of Systems  All other systems reviewed and are negative.  Past Medical History-  Past Medical History  Diagnosis Date  . Allergy   . Depression   . Anxiety    Medications-  Current Outpatient Prescriptions on File Prior to Visit  Medication Sig Dispense Refill  . aspirin 81 MG tablet Take 81 mg by mouth daily.    . Cholecalciferol (VITAMIN D3) 5000 UNITS CAPS Take 1 capsule by mouth daily.    Marland Kitchen levonorgestrel (MIRENA) 20 MCG/24HR IUD 1 each by Intrauterine route once.    . magnesium 30 MG tablet Take 30 mg by mouth daily.    Marland Kitchen albuterol (PROVENTIL HFA;VENTOLIN HFA) 108 (90 BASE) MCG/ACT inhaler Inhale 2 puffs into the lungs every 6 (six) hours as needed for wheezing or shortness of breath. (Patient not taking: Reported on 10/19/2014) 1 Inhaler 6   No current facility-administered medications on file prior to visit.   Allergies-  Allergies  Allergen  Reactions  . Albuterol     jitters   Physical Exam BP 116/64 mmHg  Pulse 58  Temp(Src) 98.2 F (36.8 C) (Temporal)  Resp 16  Ht 5' 6.5" (1.689 m)  Wt 172 lb (78.019 kg)  BMI 27.35 kg/m2  SpO2 98%  LMP 10/17/2014 Wt Readings from Last 3 Encounters:  10/19/14 172 lb (78.019 kg)  01/03/14 169 lb (76.658 kg)  12/08/13 165 lb (74.844 kg)  Vitals Reviewed. General Appearance: Well nourished, in no apparent distress and had pleasant demeanor. Eyes:  PERRLA. EOMI. Conjunctiva is pink without edema, erythema or yellowing.  No scleral icterus. Sinuses: No Frontal/maxillary tenderness Ears: No erythema, edema or tenderness on both external ear cartilages and ear canals.  TMs are intact bilaterally with normal light reflexes and bulging with clear fluid.    TMs non-erythematous and non-edematous bilaterally.   Nose: Nose is symmetrical and turbinates are pink and not erythematous, edematous or  pale.    No polyps or tenderness.  Rhinorrhea present. Throat: Oral pharynx is pink and moist. Erythema and no edema in posterior pharynx   Mucosa is intact and without lesions. Tonsils are at +2 station bilaterally and do not have exudate.    Uvula is midline and not swollen. Neck: Supple,  LAD, Positive thyromegaly/goiter, trachea is midline. Full range of motion in neck intact Respiratory: CTAB,  r/r/w or stridor. No increased effort of breathing. Cardio: RRR.   m/r/g.  S1S2nl.  Abdomen: Symmetrical, soft, nontender, and flat.  +BS nl x4.   Extremities:  C/C/E in upper and lower extremities. Pulses B/L +2  Musculoskeletal: Tenderness upon palpation to right shoulder blade and right trapezius muscle.    Small "knot" noted in right trapezius muscle. No kyphosis, lordosis, scoliosis. Skin: Warm, dry, intact without rashes, lesions, ecchymosis, yellowing, cyanosis.  Neuro: Alert and oriented X3, cooperative.  Mood and affect appropriate to situation. CN II-XII grossly intact.  Normal gait.    Psych: Insight and Judgment appropriate.   Assessment and Plan 1. Acute pharyngitis, unspecified pharyngitis type - azithromycin (ZITHROMAX) 250 MG tablet; Take 2 tablets PO on day 1, then 1 tablet PO Q24H x 4 days  Dispense: 6 tablet; Refill: 1 - For cough and cough pain- promethazine-codeine (PHENERGAN WITH CODEINE) 6.25-10 MG/5ML syrup; Take 5 mLs by mouth every 6 (six) hours as needed for cough. Max: 20mL per day  Dispense: 240 mL; Refill: 0 - Gave Dexamethasone shot in office for inflammation and patient tolerated without complications- dexamethasone (DECADRON) injection 10 mg; Inject 1 mL (10 mg total) into the muscle once.  2. Muscle spasm of back/Right Shoulder -Take Flexeril as prescribed for back muscle spasm on right shoulder/back area- cyclobenzaprine (FLEXERIL) 5 MG tablet; Take 1 tablet (5 mg total) by mouth every 8 (eight) hours as needed for muscle spasms.  Dispense: 30 tablet; Refill: 1  Discussed medication effects and SE's.  Pt agreed to treatment plan. If you are not feeling better in 10-14 days, then please call the office. Please follow up in 1-3 months for physical.  Cleaster Shiffer, Lise AuerJennifer L, PA-C 1:53 PM [facility]

## 2014-10-20 ENCOUNTER — Encounter: Payer: Self-pay | Admitting: Physician Assistant

## 2014-10-20 NOTE — Addendum Note (Signed)
Addended by: Bertram SavinOUILLARD, Carianne Taira L on: 10/20/2014 12:22 AM   Modules accepted: Kipp BroodSmartSet

## 2014-11-06 IMAGING — US US SOFT TISSUE HEAD/NECK
1 series · 14 of 25 positions shown · non-contrast
Comparison: None.

CLINICAL DATA: Left-sided neck lump

EXAM:
THYROID ULTRASOUND
TECHNIQUE: Ultrasound examination of the thyroid gland and adjacent soft
tissues was performed.

[Series 1: us soft tissue head/neck · 0.06mm/px · 14 of 41 slices shown]
[im 1/41]
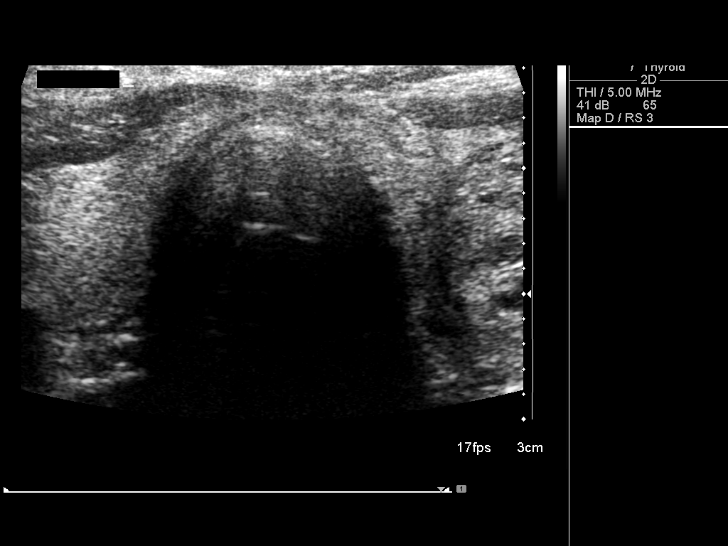
[im 4/41]
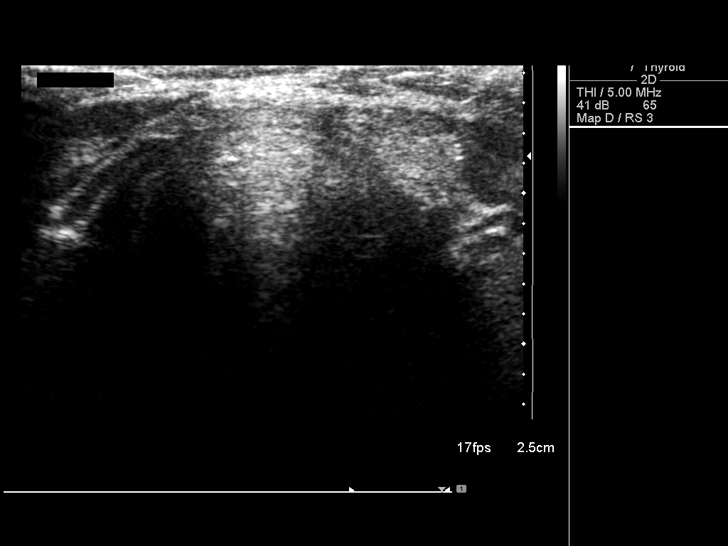
[im 7/41]
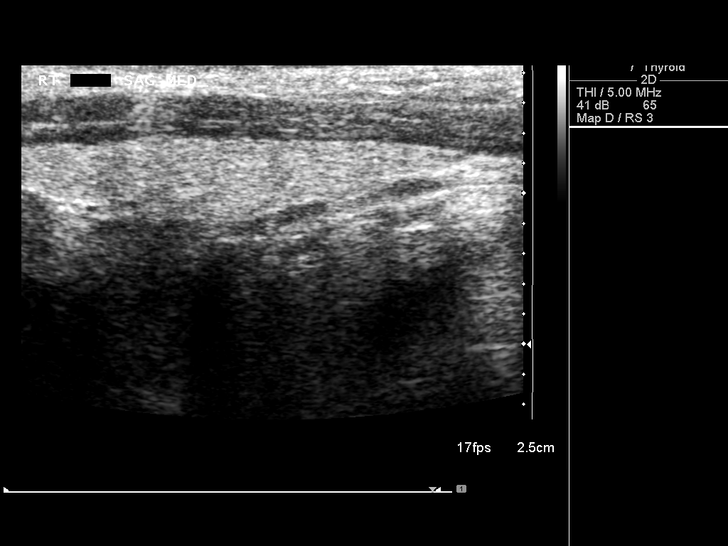
[im 11/41]
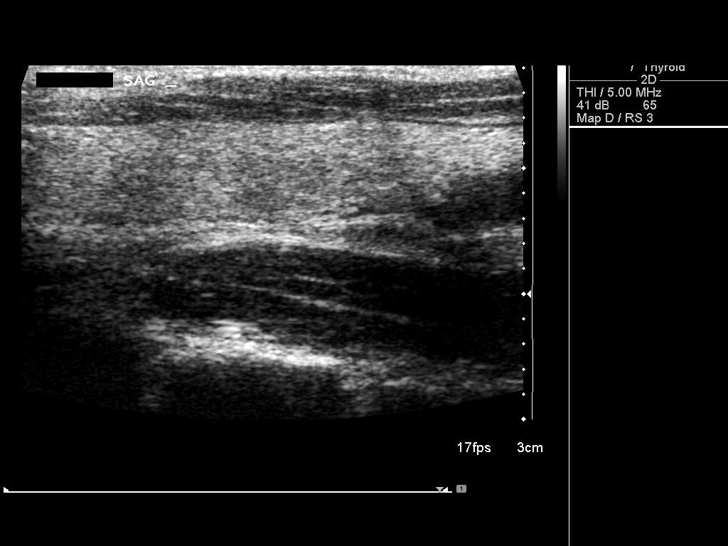
[im 14/41]
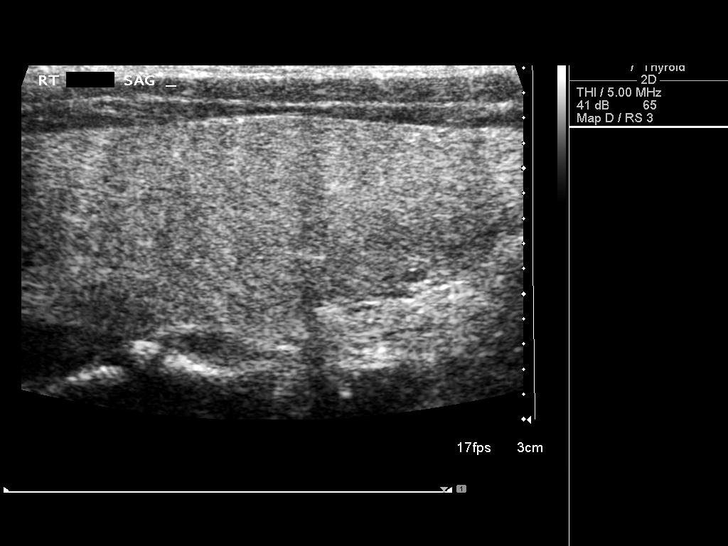
[im 16/41]
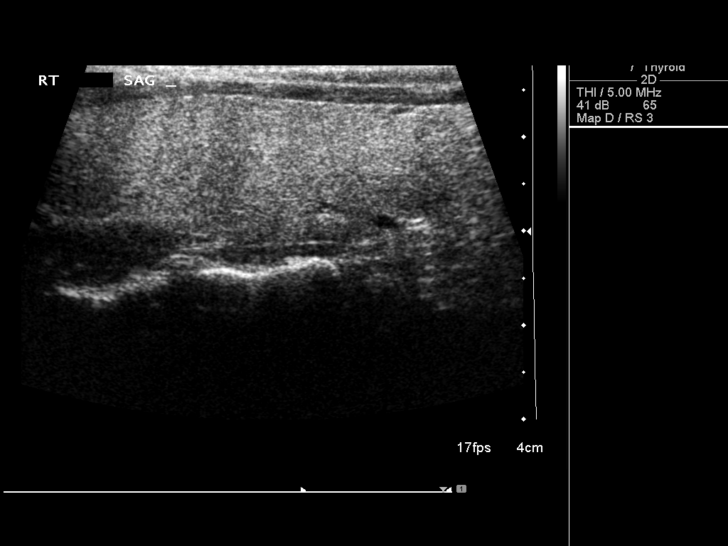
[im 19/41]
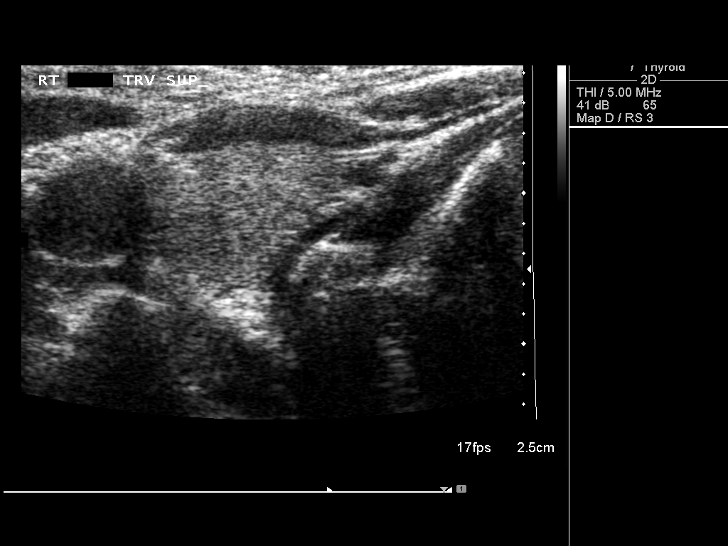
[im 22/41]
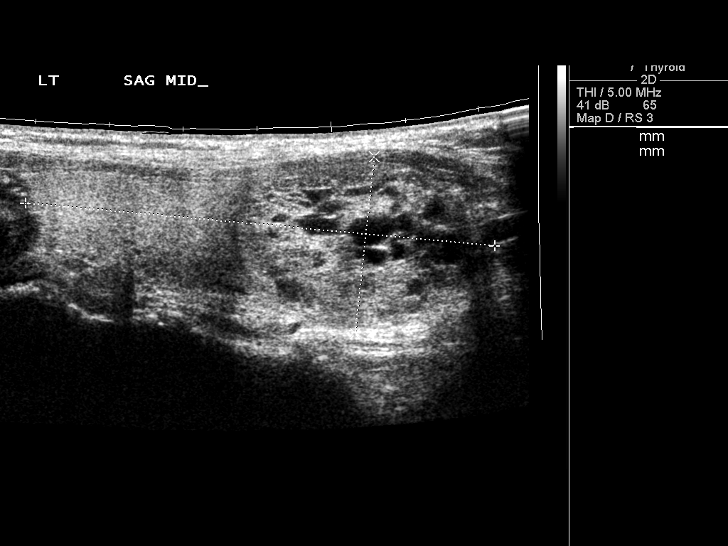
[im 26/41]
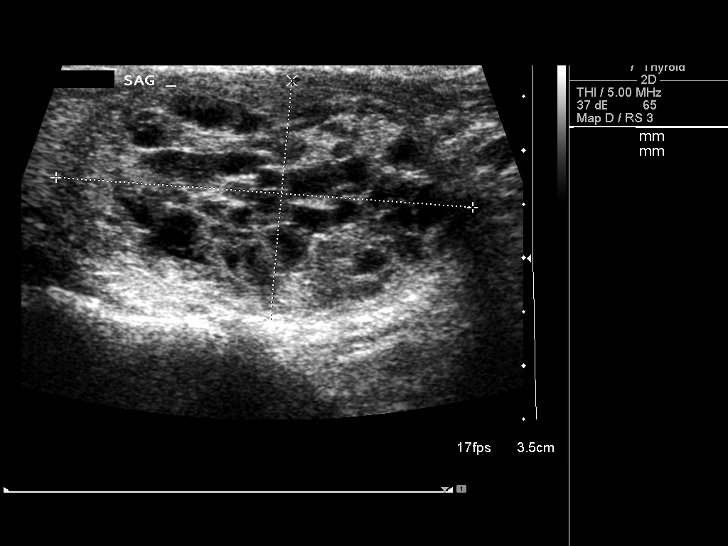
[im 27/41]
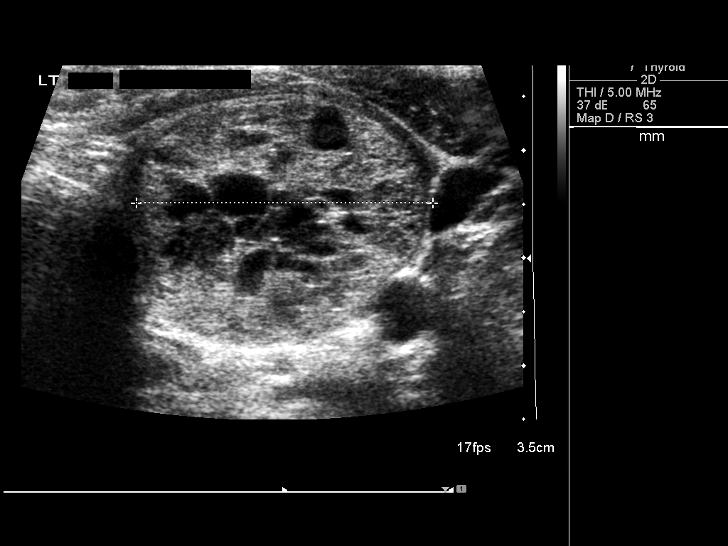
[im 31/41]
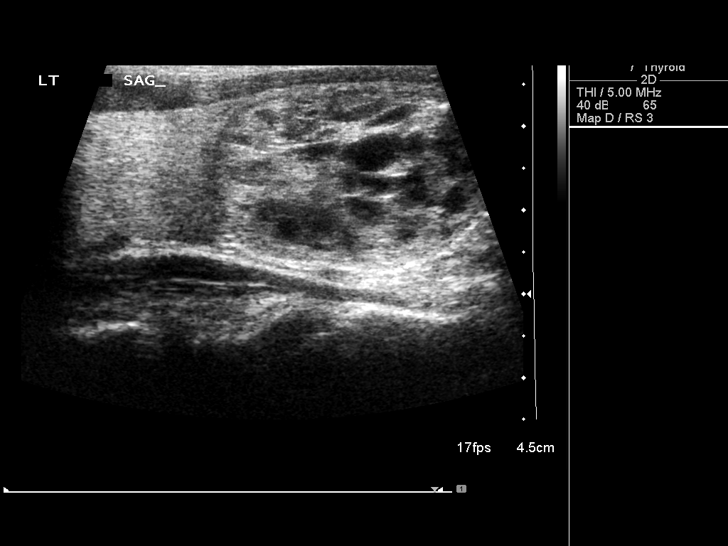
[im 34/41]
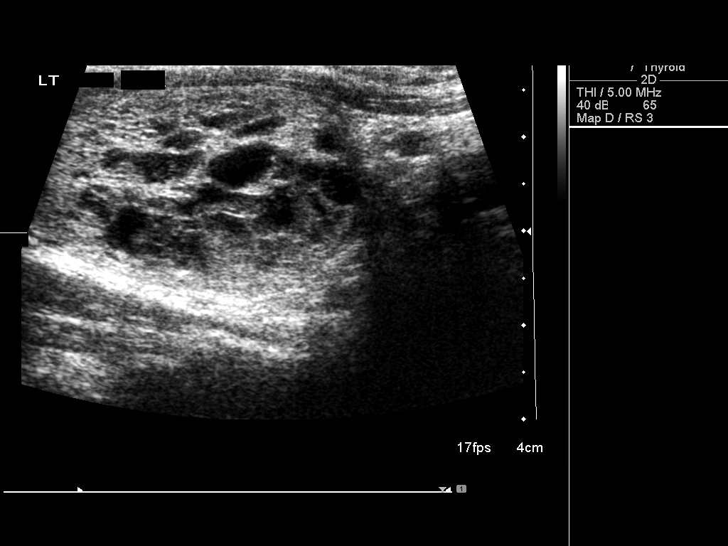
[im 37/41]
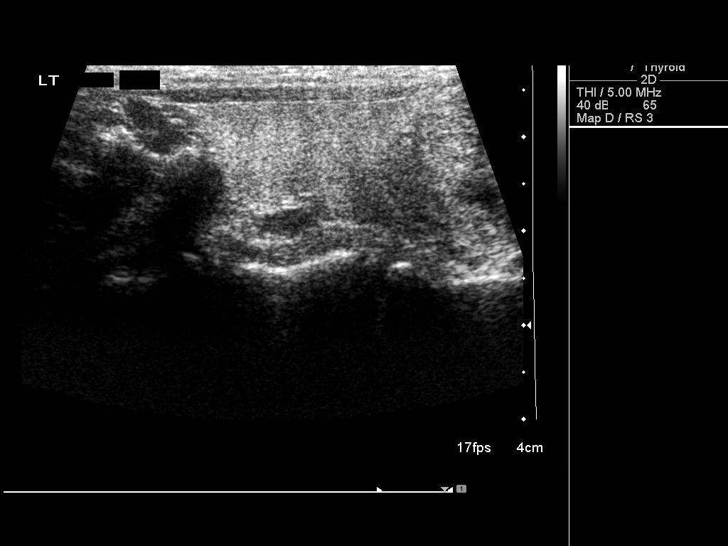
[im 41/41]
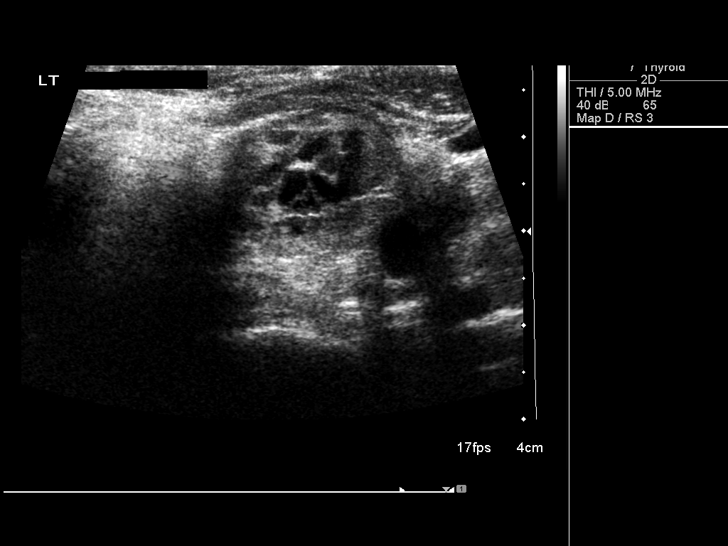

[14 of 25 positions shown; findings below may reference images not displayed]

FINDINGS: Right thyroid lobe

Measurements: 5.4 x 1.4 x 2.2 cm.. The parenchyma of the right lobe
is homogeneous.

Left thyroid lobe

Measurements: 6.4 x 2.4 x 3.0 cm.. A complex largely solid nodule in
the left mid lower lobe of thyroid measures 3.9 x 2.3 x 2.8 cm.
Findings meet consensus criteria for biopsy. Ultrasound-guided fine
needle aspiration should be considered, as per the consensus
statement: Management of Thyroid Nodules Detected at US: Society of
Radiologists in Ultrasound Consensus Conference Statement. Radiology
8334; [DATE].

Isthmus

Thickness: 3 mm in thickness.  No nodules visualized.

Lymphadenopathy

None visualized.
IMPRESSION: Complex nodule in mid lower left lobe of thyroid of 3.9 cm in
maximum diameter. This nodule does meet criteria for percutaneous
biopsy.

## 2014-11-20 IMAGING — US US THYROID BIOPSY
1 series · 11 of 11 positions shown · non-contrast
Comparison: None.

CLINICAL DATA: 4 cm partially solid and partially cystic nodule of
the left lobe of the thyroid gland.

EXAM:
ULTRASOUND GUIDED NEEDLE ASPIRATE BIOPSY OF THE THYROID GLAND

[Series 1: us thyroid biopsy · 0.07mm/px · 11 acquisitions, 11 frames shown]
[im 1/11]
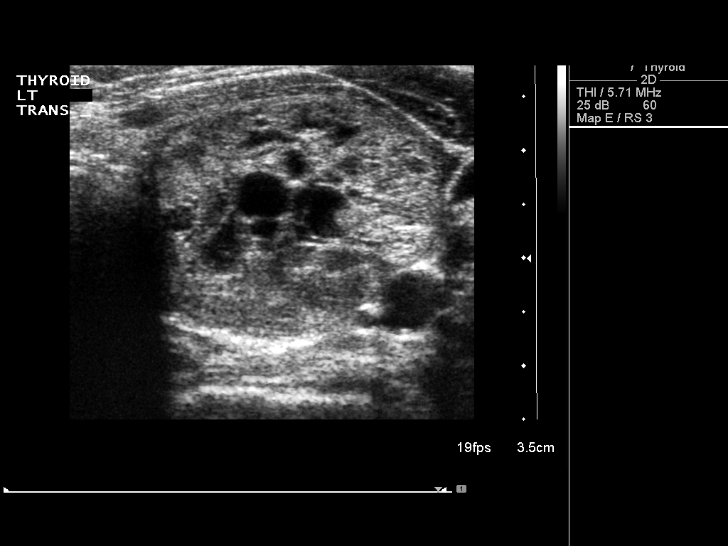
[im 2/11]
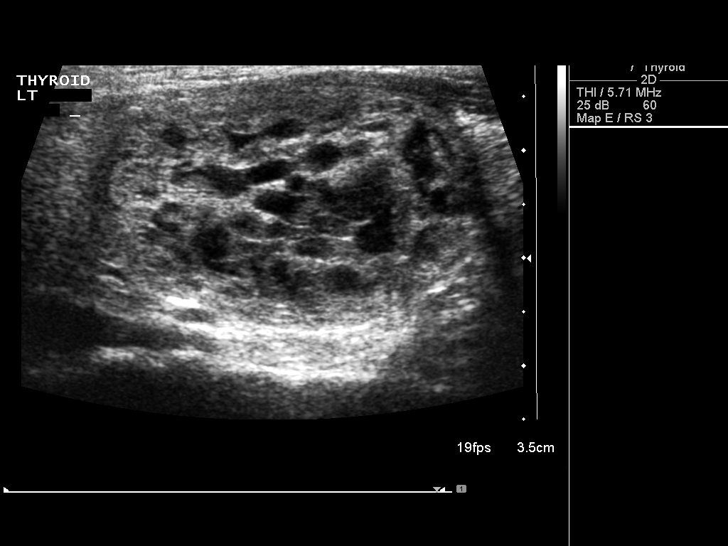
[im 3/11]
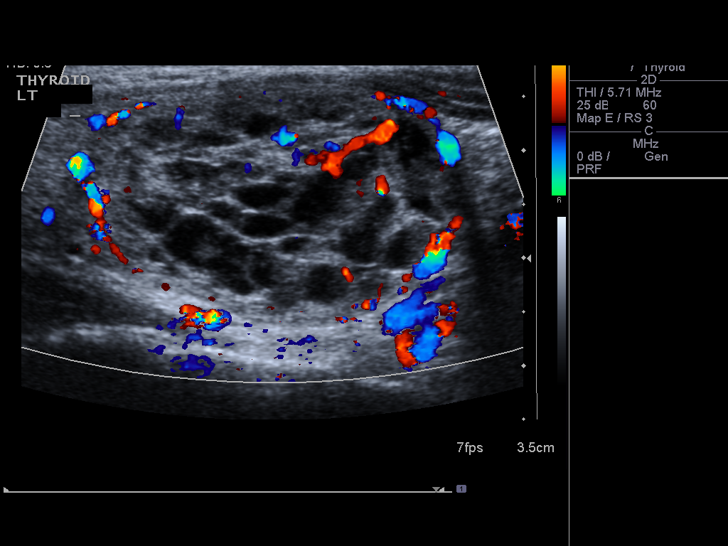
[im 4/11]
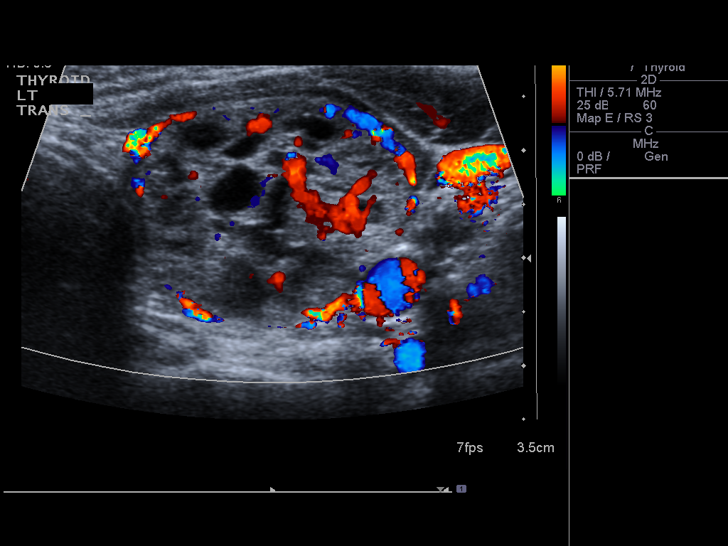
[im 5/11]
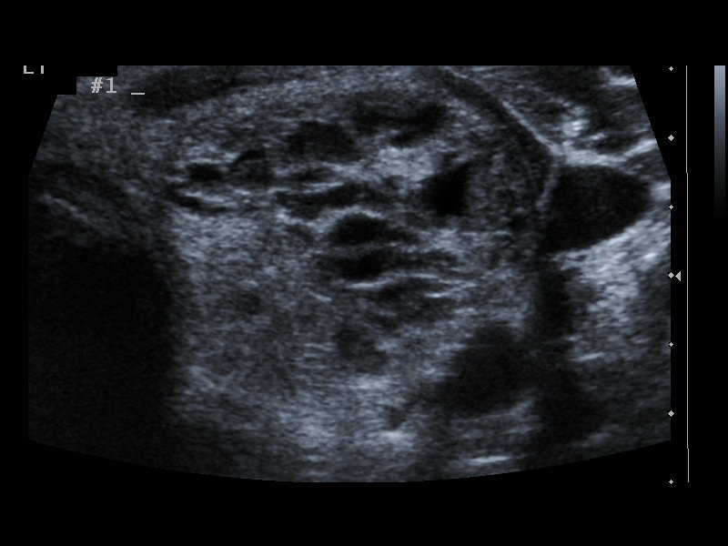
[im 6/11]
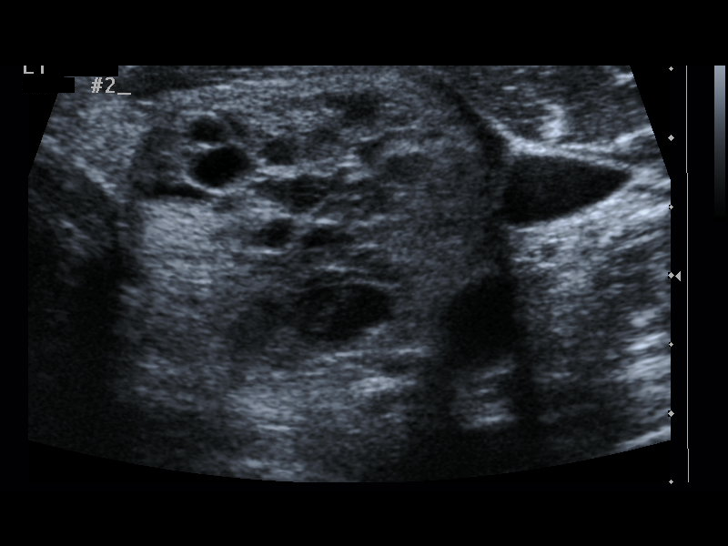
[im 7/11]
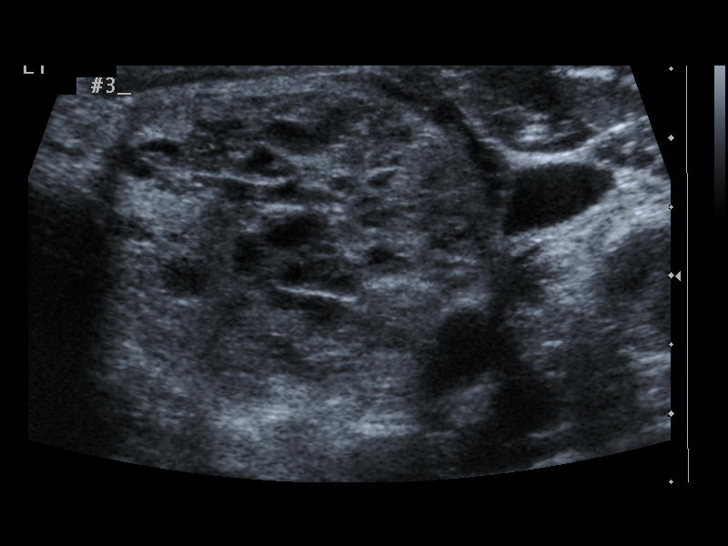
[im 8/11]
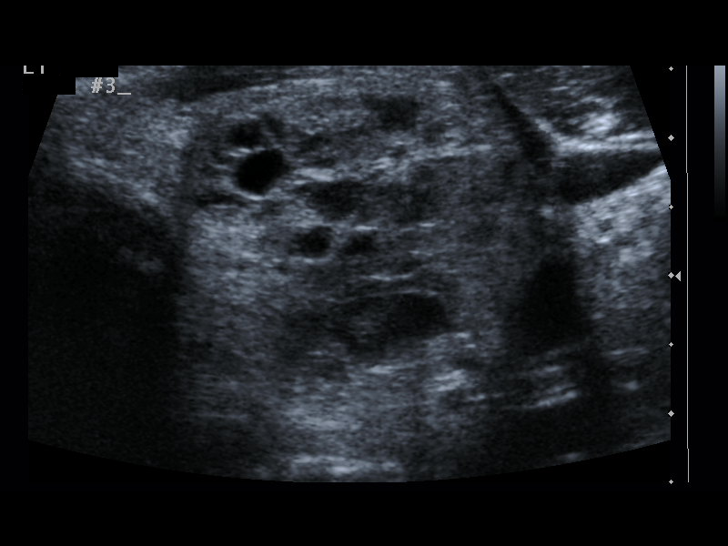
[im 9/11]
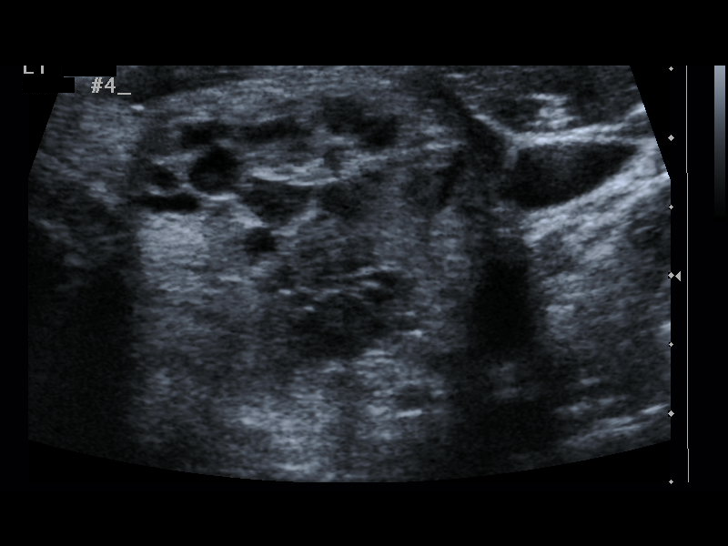
[im 10/11]
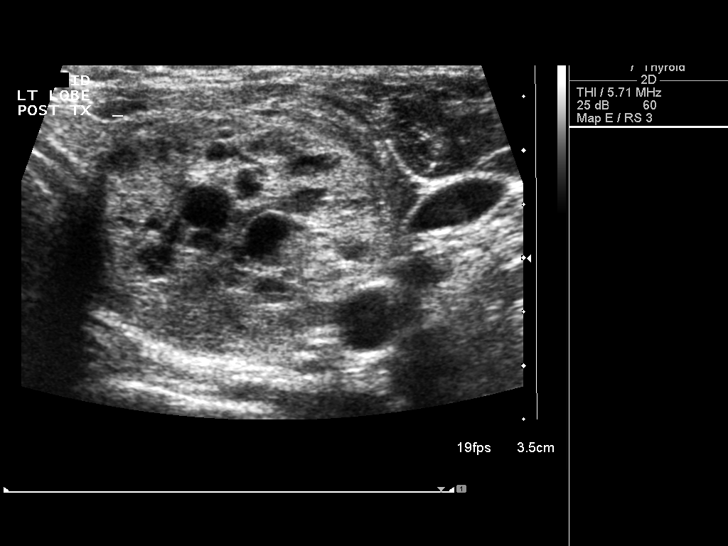
[im 11/11]
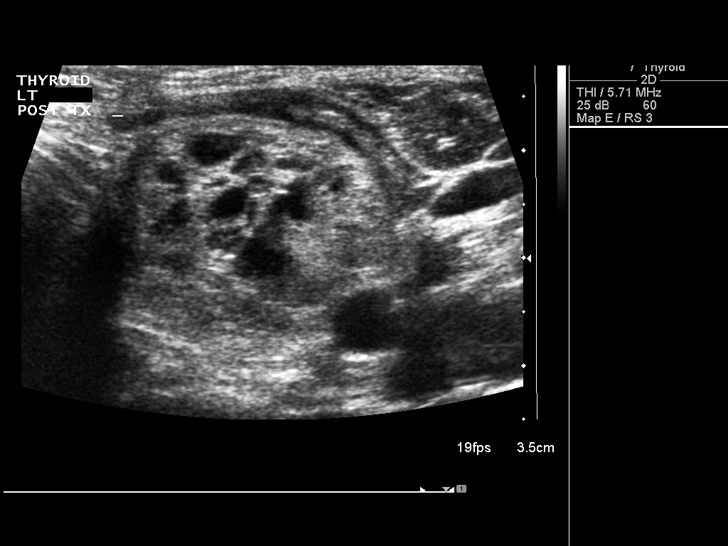

[11 of 11 positions shown; findings below may reference images not displayed]

FINDINGS: The dominant left thyroid nodule was localized. Needle samples were
obtained in different portions of the nodule.
IMPRESSION: Ultrasound guided needle aspirate biopsy performed of the dominant
left thyroid nodule.

PROCEDURE:
Thyroid biopsy was thoroughly discussed with the patient and
questions were answered. The benefits, risks, alternatives, and
complications were also discussed. The patient understands and
wishes to proceed with the procedure. Written consent was obtained.

Ultrasound was performed to localize and mark an adequate site for
the biopsy. The patient was then prepped and draped in a normal
sterile fashion. Local anesthesia was provided with 1% lidocaine.
Using direct ultrasound guidance, 4 passes were made using 25 gauge
needles into the nodule within the left lobe of the thyroid.
Ultrasound was used to confirm needle placements on all occasions.
Specimens were sent to Pathology for analysis.

Complications:  None

## 2015-02-14 ENCOUNTER — Encounter: Payer: Self-pay | Admitting: Physician Assistant

## 2015-02-22 ENCOUNTER — Encounter: Payer: Self-pay | Admitting: Physician Assistant

## 2015-02-22 ENCOUNTER — Ambulatory Visit (INDEPENDENT_AMBULATORY_CARE_PROVIDER_SITE_OTHER): Payer: BLUE CROSS/BLUE SHIELD | Admitting: Physician Assistant

## 2015-02-22 VITALS — BP 110/58 | HR 62 | Temp 98.6°F | Resp 18 | Ht 66.5 in | Wt 168.0 lb

## 2015-02-22 DIAGNOSIS — Z0000 Encounter for general adult medical examination without abnormal findings: Secondary | ICD-10-CM

## 2015-02-22 DIAGNOSIS — E559 Vitamin D deficiency, unspecified: Secondary | ICD-10-CM

## 2015-02-22 DIAGNOSIS — Z23 Encounter for immunization: Secondary | ICD-10-CM

## 2015-02-22 DIAGNOSIS — E041 Nontoxic single thyroid nodule: Secondary | ICD-10-CM

## 2015-02-22 DIAGNOSIS — E8881 Metabolic syndrome: Secondary | ICD-10-CM

## 2015-02-22 DIAGNOSIS — Z79899 Other long term (current) drug therapy: Secondary | ICD-10-CM

## 2015-02-22 DIAGNOSIS — E88819 Insulin resistance, unspecified: Secondary | ICD-10-CM

## 2015-02-22 DIAGNOSIS — F329 Major depressive disorder, single episode, unspecified: Secondary | ICD-10-CM

## 2015-02-22 DIAGNOSIS — J45909 Unspecified asthma, uncomplicated: Secondary | ICD-10-CM

## 2015-02-22 DIAGNOSIS — F32A Depression, unspecified: Secondary | ICD-10-CM

## 2015-02-22 LAB — CBC WITH DIFFERENTIAL/PLATELET
Basophils Absolute: 0 10*3/uL (ref 0.0–0.1)
Basophils Relative: 0 % (ref 0–1)
Eosinophils Absolute: 0.2 10*3/uL (ref 0.0–0.7)
Eosinophils Relative: 3 % (ref 0–5)
HEMATOCRIT: 39.9 % (ref 36.0–46.0)
HEMOGLOBIN: 13.4 g/dL (ref 12.0–15.0)
LYMPHS ABS: 2.1 10*3/uL (ref 0.7–4.0)
LYMPHS PCT: 36 % (ref 12–46)
MCH: 31.1 pg (ref 26.0–34.0)
MCHC: 33.6 g/dL (ref 30.0–36.0)
MCV: 92.6 fL (ref 78.0–100.0)
MONO ABS: 0.5 10*3/uL (ref 0.1–1.0)
MPV: 10.1 fL (ref 8.6–12.4)
Monocytes Relative: 9 % (ref 3–12)
NEUTROS ABS: 3 10*3/uL (ref 1.7–7.7)
NEUTROS PCT: 52 % (ref 43–77)
Platelets: 287 10*3/uL (ref 150–400)
RBC: 4.31 MIL/uL (ref 3.87–5.11)
RDW: 13.6 % (ref 11.5–15.5)
WBC: 5.8 10*3/uL (ref 4.0–10.5)

## 2015-02-22 MED ORDER — LEVALBUTEROL TARTRATE 45 MCG/ACT IN AERO: 2.0000 | INHALATION_SPRAY | Freq: Three times a day (TID) | RESPIRATORY_TRACT | Status: DC | PRN

## 2015-02-22 NOTE — Progress Notes (Addendum)
Complete Physical  Assessment and Plan:  Thyroid nodule/mass- check TSH, seems to be larger than before and potential compressive symptoms will refer to surgeon.  Depression/anxiety- continue prozac- in remission.  Vitamin D- continue medication General health maintainence    HPI Patient presents for complete physical.   Anxiety/depression on Prozac and states it is well controlled.  Vitamin D def on medications.  Negative biopsy thyroid nodule 08/2013, referred to Dr. Elvera LennoxGherghe for large left thyroid nodule.  Has seen Dr. Daphine DeutscherMartin in the past but is not interested in seeing him again.  She states that she feels it has gotten a bit larger. She still has intermittent dyspnea, positional and feels that her breathing is worse when it is larger.  She denies dysphagia but has been clearing her throat more, non productive cough, feels she will lose voice, she is on zyrtec for allergies.  Has seen Dr. Maple HudsonYoung, had PFTs 09/2013, and thought to have mild asthma, on albuterol.  She has had a normal cholesterol,  Lab Results  Component Value Date   CHOL 125 08/16/2013   HDL 55 08/16/2013   LDLCALC 57 08/16/2013   TRIG 67 08/16/2013   CHOLHDL 2.3 08/16/2013  In 2014 her A1C was normal at 5.3 but she had an elevated insulin at 30.  Wt Readings from Last 3 Encounters:  02/22/15 168 lb (76.204 kg)  10/19/14 172 lb (78.019 kg)  01/03/14 169 lb (76.658 kg)  She is married with 381 son, 36 years old  Current Medications:    Medication List       This list is accurate as of: 02/22/15  2:03 PM.  Always use your most recent med list.               albuterol 108 (90 BASE) MCG/ACT inhaler  Commonly known as:  PROVENTIL HFA;VENTOLIN HFA  Inhale 2 puffs into the lungs every 6 (six) hours as needed for wheezing or shortness of breath.     aspirin 81 MG tablet  Take 81 mg by mouth daily.     cyclobenzaprine 5 MG tablet  Commonly known as:  FLEXERIL  Take 1 tablet (5 mg total) by mouth every 8 (eight)  hours as needed for muscle spasms.     levonorgestrel 20 MCG/24HR IUD  Commonly known as:  MIRENA  1 each by Intrauterine route once.     magnesium 30 MG tablet  Take 30 mg by mouth daily.     Vitamin D3 5000 UNITS Caps  Take 1 capsule by mouth daily.       Health Maintainance:  Health Maintenance  Topic Date Due  . HIV Screening  09/16/1994  . PAP SMEAR  02/10/2014  . INFLUENZA VACCINE  05/08/2015  . TETANUS/TDAP  08/14/2015   Tetatus 2006, DUE TDAP Pap: 02/2014, had 1 abnormal pap but on repeat normal within last 10 years Sexually active: yes, STD testing offered but declines.  LMP: on mirena placed 2015 [employer] N/A Colonoscopy N/A Eye Doctor Dr. Hyacinth MeekerMiller Dec 2014, wears glasses, 2 years  Allergies:  Allergies  Allergen Reactions  . Albuterol     jitters   Medical History:  Past Medical History  Diagnosis Date  . Allergy   . Depression   . Anxiety    Surgical History: No past surgical history on file. Family History:  Family History  Problem Relation Age of Onset  . Heart disease Maternal Grandmother   . Brain cancer Maternal Grandfather   . Breast cancer Maternal  Aunt    Social History:  History  Substance Use Topics  . Smoking status: Former Smoker -- 0.50 packs/day for 11 years    Types: Cigarettes    Quit date: 07/27/2007  . Smokeless tobacco: Never Used  . Alcohol Use: Yes     Comment: occasionally   Review of Systems  Constitutional: Negative.  Negative for fever and chills.  HENT: Positive for sore throat. Negative for congestion, ear discharge, ear pain, hearing loss, nosebleeds and tinnitus.   Respiratory: Positive for cough and shortness of breath. Negative for hemoptysis, sputum production, wheezing and stridor.   Cardiovascular: Negative.   Gastrointestinal: Negative.   Genitourinary: Negative.   Musculoskeletal: Negative.   Skin: Negative.  Negative for rash.  Neurological: Negative.  Negative for headaches.  Psychiatric/Behavioral:  Negative.      Physical Exam: Estimated body mass index is 26.71 kg/(m^2) as calculated from the following:   Height as of this encounter: 5' 6.5" (1.689 m).   Weight as of this encounter: 168 lb (76.204 kg). Filed Vitals:   02/22/15 1356  BP: 110/58  Pulse: 62  Temp: 98.6 F (37 C)  Resp: 18   General Appearance: Well nourished, in no apparent distress. Eyes: PERRLA, EOMs, conjunctiva no swelling or erythema, normal fundi and vessels. Sinuses: No Frontal/maxillary tenderness ENT/Mouth: Ext aud canals clear, normal light reflex with TMs without erythema, bulging.  Good dentition. No erythema, swelling, or exudate on post pharynx. Tonsils not swollen or erythematous. Hearing normal.  Neck: Supple, + thyroid mass on left lower lobe. No bruits Respiratory: Respiratory effort normal, BS equal bilaterally without rales, rhonci, wheezing or stridor. Cardio: Heart sounds normal, regular rate and rhythm without murmurs, rubs or gallops. Peripheral pulses brisk and equal bilaterally, without edema.  Chest: symmetric, with normal excursions and percussion. Breasts: defer OB/GYN Abdomen: Flat, soft, with bowl sounds, no tenderness, no guarding, rebound, hernias, masses, or organomegaly. .  Lymphatics: Non tender without lymphadenopathy Genitourinary: defer Musculoskeletal: Full ROM all peripheral extremities,5/5 strength, and normal gait. Skin: Warm, dry without rashes, lesions, ecchymosis.  Neuro: Cranial nerves intact, reflexes equal bilaterally. Normal muscle tone, no cerebellar symptoms. Sensation intact.  Pysch: Awake and oriented X 3, normal affect, Insight and Judgment appropriate.   EKG:  Normal   Quentin MullingCollier, Amanda 2:03 PM

## 2015-02-22 NOTE — Patient Instructions (Signed)
Preventive Care for Adults A healthy lifestyle and preventive care can promote health and wellness. Preventive health guidelines for women include the following key practices.  A routine yearly physical is a good way to check with your health care provider about your health and preventive screening. It is a chance to share any concerns and updates on your health and to receive a thorough exam.  Visit your dentist for a routine exam and preventive care every 6 months. Brush your teeth twice a day and floss once a day. Good oral hygiene prevents tooth decay and gum disease.  The frequency of eye exams is based on your age, health, family medical history, use of contact lenses, and other factors. Follow your health care provider's recommendations for frequency of eye exams.  Eat a healthy diet. Foods like vegetables, fruits, whole grains, low-fat dairy products, and lean protein foods contain the nutrients you need without too many calories. Decrease your intake of foods high in solid fats, added sugars, and salt. Eat the right amount of calories for you.Get information about a proper diet from your health care provider, if necessary.  Regular physical exercise is one of the most important things you can do for your health. Most adults should get at least 150 minutes of moderate-intensity exercise (any activity that increases your heart rate and causes you to sweat) each week. In addition, most adults need muscle-strengthening exercises on 2 or more days a week.  Maintain a healthy weight. The body mass index (BMI) is a screening tool to identify possible weight problems. It provides an estimate of body fat based on height and weight. Your health care provider can find your BMI and can help you achieve or maintain a healthy weight.For adults 20 years and older:  A BMI below 18.5 is considered underweight.  A BMI of 18.5 to 24.9 is normal.  A BMI of 25 to 29.9 is considered overweight.  A BMI of  30 and above is considered obese.  Maintain normal blood lipids and cholesterol levels by exercising and minimizing your intake of saturated fat. Eat a balanced diet with plenty of fruit and vegetables. Blood tests for lipids and cholesterol should begin at age 76 and be repeated every 5 years. If your lipid or cholesterol levels are high, you are over 50, or you are at high risk for heart disease, you may need your cholesterol levels checked more frequently.Ongoing high lipid and cholesterol levels should be treated with medicines if diet and exercise are not working.  If you smoke, find out from your health care provider how to quit. If you do not use tobacco, do not start.  Lung cancer screening is recommended for adults aged 22-80 years who are at high risk for developing lung cancer because of a history of smoking. A yearly low-dose CT scan of the lungs is recommended for people who have at least a 30-pack-year history of smoking and are a current smoker or have quit within the past 15 years. A pack year of smoking is smoking an average of 1 pack of cigarettes a day for 1 year (for example: 1 pack a day for 30 years or 2 packs a day for 15 years). Yearly screening should continue until the smoker has stopped smoking for at least 15 years. Yearly screening should be stopped for people who develop a health problem that would prevent them from having lung cancer treatment.  If you are pregnant, do not drink alcohol. If you are breastfeeding,  be very cautious about drinking alcohol. If you are not pregnant and choose to drink alcohol, do not have more than 1 drink per day. One drink is considered to be 12 ounces (355 mL) of beer, 5 ounces (148 mL) of wine, or 1.5 ounces (44 mL) of liquor.  Avoid use of street drugs. Do not share needles with anyone. Ask for help if you need support or instructions about stopping the use of drugs.  High blood pressure causes heart disease and increases the risk of  stroke. Your blood pressure should be checked at least every 1 to 2 years. Ongoing high blood pressure should be treated with medicines if weight loss and exercise do not work.  If you are 3-86 years old, ask your health care provider if you should take aspirin to prevent strokes.  Diabetes screening involves taking a blood sample to check your fasting blood sugar level. This should be done once every 3 years, after age 67, if you are within normal weight and without risk factors for diabetes. Testing should be considered at a younger age or be carried out more frequently if you are overweight and have at least 1 risk factor for diabetes.  Breast cancer screening is essential preventive care for women. You should practice "breast self-awareness." This means understanding the normal appearance and feel of your breasts and may include breast self-examination. Any changes detected, no matter how small, should be reported to a health care provider. Women in their 8s and 30s should have a clinical breast exam (CBE) by a health care provider as part of a regular health exam every 1 to 3 years. After age 70, women should have a CBE every year. Starting at age 25, women should consider having a mammogram (breast X-ray test) every year. Women who have a family history of breast cancer should talk to their health care provider about genetic screening. Women at a high risk of breast cancer should talk to their health care providers about having an MRI and a mammogram every year.  Breast cancer gene (BRCA)-related cancer risk assessment is recommended for women who have family members with BRCA-related cancers. BRCA-related cancers include breast, ovarian, tubal, and peritoneal cancers. Having family members with these cancers may be associated with an increased risk for harmful changes (mutations) in the breast cancer genes BRCA1 and BRCA2. Results of the assessment will determine the need for genetic counseling and  BRCA1 and BRCA2 testing.  Routine pelvic exams to screen for cancer are no longer recommended for nonpregnant women who are considered low risk for cancer of the pelvic organs (ovaries, uterus, and vagina) and who do not have symptoms. Ask your health care provider if a screening pelvic exam is right for you.  If you have had past treatment for cervical cancer or a condition that could lead to cancer, you need Pap tests and screening for cancer for at least 20 years after your treatment. If Pap tests have been discontinued, your risk factors (such as having a new sexual partner) need to be reassessed to determine if screening should be resumed. Some women have medical problems that increase the chance of getting cervical cancer. In these cases, your health care provider may recommend more frequent screening and Pap tests.  The HPV test is an additional test that may be used for cervical cancer screening. The HPV test looks for the virus that can cause the cell changes on the cervix. The cells collected during the Pap test can be  tested for HPV. The HPV test could be used to screen women aged 30 years and older, and should be used in women of any age who have unclear Pap test results. After the age of 30, women should have HPV testing at the same frequency as a Pap test.  Colorectal cancer can be detected and often prevented. Most routine colorectal cancer screening begins at the age of 50 years and continues through age 75 years. However, your health care provider may recommend screening at an earlier age if you have risk factors for colon cancer. On a yearly basis, your health care provider may provide home test kits to check for hidden blood in the stool. Use of a small camera at the end of a tube, to directly examine the colon (sigmoidoscopy or colonoscopy), can detect the earliest forms of colorectal cancer. Talk to your health care provider about this at age 50, when routine screening begins. Direct  exam of the colon should be repeated every 5-10 years through age 75 years, unless early forms of pre-cancerous polyps or small growths are found.  People who are at an increased risk for hepatitis B should be screened for this virus. You are considered at high risk for hepatitis B if:  You were born in a country where hepatitis B occurs often. Talk with your health care provider about which countries are considered high risk.  Your parents were born in a high-risk country and you have not received a shot to protect against hepatitis B (hepatitis B vaccine).  You have HIV or AIDS.  You use needles to inject street drugs.  You live with, or have sex with, someone who has hepatitis B.  You get hemodialysis treatment.  You take certain medicines for conditions like cancer, organ transplantation, and autoimmune conditions.  Hepatitis C blood testing is recommended for all people born from 1945 through 1965 and any individual with known risks for hepatitis C.  Practice safe sex. Use condoms and avoid high-risk sexual practices to reduce the spread of sexually transmitted infections (STIs). STIs include gonorrhea, chlamydia, syphilis, trichomonas, herpes, HPV, and human immunodeficiency virus (HIV). Herpes, HIV, and HPV are viral illnesses that have no cure. They can result in disability, cancer, and death.  You should be screened for sexually transmitted illnesses (STIs) including gonorrhea and chlamydia if:  You are sexually active and are younger than 24 years.  You are older than 24 years and your health care provider tells you that you are at risk for this type of infection.  Your sexual activity has changed since you were last screened and you are at an increased risk for chlamydia or gonorrhea. Ask your health care provider if you are at risk.  If you are at risk of being infected with HIV, it is recommended that you take a prescription medicine daily to prevent HIV infection. This is  called preexposure prophylaxis (PrEP). You are considered at risk if:  You are a heterosexual woman, are sexually active, and are at increased risk for HIV infection.  You take drugs by injection.  You are sexually active with a partner who has HIV.  Talk with your health care provider about whether you are at high risk of being infected with HIV. If you choose to begin PrEP, you should first be tested for HIV. You should then be tested every 3 months for as long as you are taking PrEP.  Osteoporosis is a disease in which the bones lose minerals and strength   with aging. This can result in serious bone fractures or breaks. The risk of osteoporosis can be identified using a bone density scan. Women ages 65 years and over and women at risk for fractures or osteoporosis should discuss screening with their health care providers. Ask your health care provider whether you should take a calcium supplement or vitamin D to reduce the rate of osteoporosis.  Menopause can be associated with physical symptoms and risks. Hormone replacement therapy is available to decrease symptoms and risks. You should talk to your health care provider about whether hormone replacement therapy is right for you.  Use sunscreen. Apply sunscreen liberally and repeatedly throughout the day. You should seek shade when your shadow is shorter than you. Protect yourself by wearing long sleeves, pants, a wide-brimmed hat, and sunglasses year round, whenever you are outdoors.  Once a month, do a whole body skin exam, using a mirror to look at the skin on your back. Tell your health care provider of new moles, moles that have irregular borders, moles that are larger than a pencil eraser, or moles that have changed in shape or color.  Stay current with required vaccines (immunizations).  Influenza vaccine. All adults should be immunized every year.  Tetanus, diphtheria, and acellular pertussis (Td, Tdap) vaccine. Pregnant women should  receive 1 dose of Tdap vaccine during each pregnancy. The dose should be obtained regardless of the length of time since the last dose. Immunization is preferred during the 27th-36th week of gestation. An adult who has not previously received Tdap or who does not know her vaccine status should receive 1 dose of Tdap. This initial dose should be followed by tetanus and diphtheria toxoids (Td) booster doses every 10 years. Adults with an unknown or incomplete history of completing a 3-dose immunization series with Td-containing vaccines should begin or complete a primary immunization series including a Tdap dose. Adults should receive a Td booster every 10 years.  Varicella vaccine. An adult without evidence of immunity to varicella should receive 2 doses or a second dose if she has previously received 1 dose. Pregnant females who do not have evidence of immunity should receive the first dose after pregnancy. This first dose should be obtained before leaving the health care facility. The second dose should be obtained 4-8 weeks after the first dose.  Human papillomavirus (HPV) vaccine. Females aged 13-26 years who have not received the vaccine previously should obtain the 3-dose series. The vaccine is not recommended for use in pregnant females. However, pregnancy testing is not needed before receiving a dose. If a female is found to be pregnant after receiving a dose, no treatment is needed. In that case, the remaining doses should be delayed until after the pregnancy. Immunization is recommended for any person with an immunocompromised condition through the age of 26 years if she did not get any or all doses earlier. During the 3-dose series, the second dose should be obtained 4-8 weeks after the first dose. The third dose should be obtained 24 weeks after the first dose and 16 weeks after the second dose.  Zoster vaccine. One dose is recommended for adults aged 60 years or older unless certain conditions are  present.  Measles, mumps, and rubella (MMR) vaccine. Adults born before 1957 generally are considered immune to measles and mumps. Adults born in 1957 or later should have 1 or more doses of MMR vaccine unless there is a contraindication to the vaccine or there is laboratory evidence of immunity to   each of the three diseases. A routine second dose of MMR vaccine should be obtained at least 28 days after the first dose for students attending postsecondary schools, health care workers, or international travelers. People who received inactivated measles vaccine or an unknown type of measles vaccine during 1963-1967 should receive 2 doses of MMR vaccine. People who received inactivated mumps vaccine or an unknown type of mumps vaccine before 1979 and are at high risk for mumps infection should consider immunization with 2 doses of MMR vaccine. For females of childbearing age, rubella immunity should be determined. If there is no evidence of immunity, females who are not pregnant should be vaccinated. If there is no evidence of immunity, females who are pregnant should delay immunization until after pregnancy. Unvaccinated health care workers born before 1957 who lack laboratory evidence of measles, mumps, or rubella immunity or laboratory confirmation of disease should consider measles and mumps immunization with 2 doses of MMR vaccine or rubella immunization with 1 dose of MMR vaccine.  Pneumococcal 13-valent conjugate (PCV13) vaccine. When indicated, a person who is uncertain of her immunization history and has no record of immunization should receive the PCV13 vaccine. An adult aged 19 years or older who has certain medical conditions and has not been previously immunized should receive 1 dose of PCV13 vaccine. This PCV13 should be followed with a dose of pneumococcal polysaccharide (PPSV23) vaccine. The PPSV23 vaccine dose should be obtained at least 8 weeks after the dose of PCV13 vaccine. An adult aged 19  years or older who has certain medical conditions and previously received 1 or more doses of PPSV23 vaccine should receive 1 dose of PCV13. The PCV13 vaccine dose should be obtained 1 or more years after the last PPSV23 vaccine dose.  Pneumococcal polysaccharide (PPSV23) vaccine. When PCV13 is also indicated, PCV13 should be obtained first. All adults aged 65 years and older should be immunized. An adult younger than age 65 years who has certain medical conditions should be immunized. Any person who resides in a nursing home or long-term care facility should be immunized. An adult smoker should be immunized. People with an immunocompromised condition and certain other conditions should receive both PCV13 and PPSV23 vaccines. People with human immunodeficiency virus (HIV) infection should be immunized as soon as possible after diagnosis. Immunization during chemotherapy or radiation therapy should be avoided. Routine use of PPSV23 vaccine is not recommended for American Indians, [place] Natives, or people younger than 65 years unless there are medical conditions that require PPSV23 vaccine. When indicated, people who have unknown immunization and have no record of immunization should receive PPSV23 vaccine. One-time revaccination 5 years after the first dose of PPSV23 is recommended for people aged 19-64 years who have chronic kidney failure, nephrotic syndrome, asplenia, or immunocompromised conditions. People who received 1-2 doses of PPSV23 before age 65 years should receive another dose of PPSV23 vaccine at age 65 years or later if at least 5 years have passed since the previous dose. Doses of PPSV23 are not needed for people immunized with PPSV23 at or after age 65 years.  Meningococcal vaccine. Adults with asplenia or persistent complement component deficiencies should receive 2 doses of quadrivalent meningococcal conjugate (MenACWY-D) vaccine. The doses should be obtained at least 2 months apart.  Microbiologists working with certain meningococcal bacteria, military recruits, people at risk during an outbreak, and people who travel to or live in countries with a high rate of meningitis should be immunized. A first-year college student up through age   21 years who is living in a residence hall should receive a dose if she did not receive a dose on or after her 16th birthday. Adults who have certain high-risk conditions should receive one or more doses of vaccine.  Hepatitis A vaccine. Adults who wish to be protected from this disease, have certain high-risk conditions, work with hepatitis A-infected animals, work in hepatitis A research labs, or travel to or work in countries with a high rate of hepatitis A should be immunized. Adults who were previously unvaccinated and who anticipate close contact with an international adoptee during the first 60 days after arrival in the [place] from a country with a high rate of hepatitis A should be immunized.  Hepatitis B vaccine. Adults who wish to be protected from this disease, have certain high-risk conditions, may be exposed to blood or other infectious body fluids, are household contacts or sex partners of hepatitis B positive people, are clients or workers in certain care facilities, or travel to or work in countries with a high rate of hepatitis B should be immunized.  Haemophilus influenzae type b (Hib) vaccine. A previously unvaccinated person with asplenia or sickle cell disease or having a scheduled splenectomy should receive 1 dose of Hib vaccine. Regardless of previous immunization, a recipient of a hematopoietic stem cell transplant should receive a 3-dose series 6-12 months after her successful transplant. Hib vaccine is not recommended for adults with HIV infection. Preventive Services / Frequency  Ages 19 to 39 years 1. Blood pressure check. 2. Lipid and cholesterol check. 3. Clinical breast exam.** / Every 3 years for women in their  20s and 30s. 4. BRCA-related cancer risk assessment.** / For women who have family members with a BRCA-related cancer (breast, ovarian, tubal, or peritoneal cancers). 5. Pap test.** / Every 2 years from ages 21 through 29. Every 3 years starting at age 30 through age 65 or 70 with a history of 3 consecutive normal Pap tests. 6. HPV screening.** / Every 3 years from ages 30 through ages 65 to 70 with a history of 3 consecutive normal Pap tests. 7. Hepatitis C blood test.** / For any individual with known risks for hepatitis C. 8. Skin self-exam. / Monthly. 9. Influenza vaccine. / Every year. 10. Tetanus, diphtheria, and acellular pertussis (Tdap, Td) vaccine.** / Consult your health care provider. Pregnant women should receive 1 dose of Tdap vaccine during each pregnancy. 1 dose of Td every 10 years. 11. Varicella vaccine.** / Consult your health care provider. Pregnant females who do not have evidence of immunity should receive the first dose after pregnancy. 12. HPV vaccine. / 3 doses over 6 months, if 26 and younger. The vaccine is not recommended for use in pregnant females. However, pregnancy testing is not needed before receiving a dose. 13. Measles, mumps, rubella (MMR) vaccine.** / You need at least 1 dose of MMR if you were born in 1957 or later. You may also need a 2nd dose. For females of childbearing age, rubella immunity should be determined. If there is no evidence of immunity, females who are not pregnant should be vaccinated. If there is no evidence of immunity, females who are pregnant should delay immunization until after pregnancy. 14. Pneumococcal 13-valent conjugate (PCV13) vaccine.** / Consult your health care provider. 15. Pneumococcal polysaccharide (PPSV23) vaccine.** / 1 to 2 doses if you smoke cigarettes or if you have certain conditions. 16. Meningococcal vaccine.** / 1 dose if you are age 19 to 21 years and a   first-year college student living in a residence hall, or have one  of several medical conditions, you need to get vaccinated against meningococcal disease. You may also need additional booster doses. 17. Hepatitis A vaccine.** / Consult your health care provider. 18. Hepatitis B vaccine.** / Consult your health care provider. 19. Haemophilus influenzae type b (Hib) vaccine.** / Consult your health care provider.  24. Chlamydia, HIV, and other sexually transmitted diseases- Get screened every year until age 54, then within three months of each new sexual provider. 21. Pap Smear- Every 1-3 years; discuss with your health care provider. 28. Mammogram- Every year starting at age 40  Take these steps 1. Do not smoke-Your healthcare provider can help you quit.  For tips on how to quit go to www.smokefree.gov or call 1-800 QUITNOW. 2. Be physically active- Exercise 5 days a week for at least 30 minutes.  If you are not already physically active, start slow and gradually work up to 30 minutes of moderate physical activity.  Examples of moderate activity include walking briskly, dancing, swimming, bicycling, etc. 3. Breast Cancer- A self breast exam every month is important for early detection of breast cancer.  For more information and instruction on self breast exams, ask your healthcare provider or https://www.patel.info/. 4. Eat a healthy diet- Eat a variety of healthy foods such as fruits, vegetables, whole grains, low fat milk, low fat cheeses, yogurt, lean meats, poultry and fish, beans, nuts, tofu, etc.  For more information go to www. Thenutritionsource.org 5. Drink alcohol in moderation- Limit alcohol intake to one drink or less per day. Never drink and drive. 6. Depression- Your emotional health is as important as your physical health.  If you're feeling down or losing interest in things you normally enjoy please talk to your healthcare provider about being screened for depression. 7. Dental visit- Brush and floss your teeth twice daily;  visit your dentist twice a year. 8. Eye doctor- Get an eye exam at least every 2 years. 9. Helmet use- Always wear a helmet when riding a bicycle, motorcycle, rollerblading or skateboarding. 6. Safe sex- If you may be exposed to sexually transmitted infections, use a condom. 11. Seat belts- Seat belts can save your live; always wear one. 12. Smoke/Carbon Monoxide detectors- These detectors need to be installed on the appropriate level of your home. Replace batteries at least once a year. 13. Skin cancer- When out in the sun please cover up and use sunscreen 15 SPF or higher. 14. Violence- If anyone is threatening or hurting you, please tell your healthcare provider.

## 2015-02-23 LAB — BASIC METABOLIC PANEL WITH GFR
BUN: 8 mg/dL (ref 6–23)
CO2: 27 mEq/L (ref 19–32)
Calcium: 9.4 mg/dL (ref 8.4–10.5)
Chloride: 105 mEq/L (ref 96–112)
Creat: 0.63 mg/dL (ref 0.50–1.10)
GFR, Est African American: >89 mL/min
GFR, Est Non African American: >89 mL/min
Glucose, Bld: 86 mg/dL (ref 70–99)
POTASSIUM: 3.8 meq/L (ref 3.5–5.3)
Sodium: 139 mEq/L (ref 135–145)

## 2015-02-23 LAB — INSULIN, FASTING: INSULIN FASTING, SERUM: 7.6 u[IU]/mL (ref 2.0–19.6)

## 2015-02-23 LAB — HEPATIC FUNCTION PANEL
ALBUMIN: 4.5 g/dL (ref 3.5–5.2)
ALT: 12 U/L (ref 0–35)
AST: 16 U/L (ref 0–37)
Alkaline Phosphatase: 50 U/L (ref 39–117)
BILIRUBIN TOTAL: 0.5 mg/dL (ref 0.2–1.2)
Bilirubin, Direct: 0.1 mg/dL (ref 0.0–0.3)
Indirect Bilirubin: 0.4 mg/dL (ref 0.2–1.2)
Total Protein: 7.1 g/dL (ref 6.0–8.3)

## 2015-02-23 LAB — MAGNESIUM: MAGNESIUM: 1.9 mg/dL (ref 1.5–2.5)

## 2015-02-23 LAB — LIPID PANEL
CHOL/HDL RATIO: 2.3 ratio
Cholesterol: 137 mg/dL (ref 0–200)
HDL: 60 mg/dL (ref >=46)
LDL CALC: 66 mg/dL (ref 0–99)
Triglycerides: 56 mg/dL (ref <150)
VLDL: 11 mg/dL (ref 0–40)

## 2015-02-23 LAB — HEMOGLOBIN A1C
Hgb A1c MFr Bld: 5.2 % (ref <5.7)
MEAN PLASMA GLUCOSE: 103 mg/dL (ref <117)

## 2015-02-23 LAB — VITAMIN D 25 HYDROXY (VIT D DEFICIENCY, FRACTURES): Vit D, 25-Hydroxy: 39 ng/mL (ref 30–100)

## 2015-02-23 LAB — TSH: TSH: 0.705 u[IU]/mL (ref 0.350–4.500)

## 2015-02-24 ENCOUNTER — Encounter: Payer: Self-pay | Admitting: Physician Assistant

## 2015-02-24 DIAGNOSIS — E041 Nontoxic single thyroid nodule: Secondary | ICD-10-CM

## 2015-03-17 ENCOUNTER — Ambulatory Visit (INDEPENDENT_AMBULATORY_CARE_PROVIDER_SITE_OTHER): Payer: BLUE CROSS/BLUE SHIELD | Admitting: Internal Medicine

## 2015-03-17 ENCOUNTER — Encounter: Payer: Self-pay | Admitting: Internal Medicine

## 2015-03-17 VITALS — BP 118/62 | HR 69 | Temp 98.9°F | Resp 12 | Wt 168.0 lb

## 2015-03-17 DIAGNOSIS — E041 Nontoxic single thyroid nodule: Secondary | ICD-10-CM | POA: Diagnosis not present

## 2015-03-17 NOTE — Progress Notes (Signed)
Patient ID: Jennifer Ponce, female   DOB: Oct 01, 1981, 34 y.o.   MRN: [id number]   HPI  Jennifer Ponce is a 34 y.o.-year-old female, initially referred by her PCP, Dr. Oneta Rack, for management of a large L thyroid nodule. She was last seen 1.3 years ago.  Reviewed and addended hx: Pt noticed in 05/2013 that she had SOB with activity and heat. She was referred to Pulmonary >> PFTs normal (including flow-loop curve) >> found a thyroid nodule by palpation >> thyroid U/S (08/04/2013): large L complex thyroid nodule 3.9 x 2.3 x 2.8 cm. FNA (08/18/2013): benign.   She saw surgery, but was told her neck compression sxs likely not from nodule. She tells me now she will have sx for the nodule with Dr Gerrit Friends - meets with him on 03/20/2015.  Pt can feel her L nodule in neck (and this is also clearly visible when pt talks and moved her head), no hoarseness, no dysphagia/odynophagia, some SOB with lying down, but mostly with exertion, movement.  I reviewed pt's thyroid tests: Lab Results  Component Value Date   TSH 0.705 02/22/2015   TSH 0.745 08/16/2013    Pt mentions - past 2 weeks: - + fatigue - sleeps 10 hours a day - + cold intolerance - + tremors - no palpitations - + anxiety - + mm spasms - no hyperdefecation/constipation - no weight loss - no weight gain - no dry skin - no hair falling  Pt does not have a FH of thyroid ds. No FH of thyroid cancer. No h/o radiation tx to head or neck.  ROS: Constitutional: + see HPI Eyes: no blurry vision, no xerophthalmia ENT: no sore throat,+ nodules palpated in throat, no dysphagia/odynophagia, + hoarseness Cardiovascular: no CP/SOB/palpitations/leg swelling Respiratory: + cough/+ SOB/+ wheezing Gastrointestinal: no N/V/D/C Musculoskeletal: no muscle/joint aches Skin: no rashes Neurological: no tremors/numbness/tingling/dizziness Psychiatric: no depression/+ anxiety  Past Medical History  Diagnosis Date  . Allergy   . Depression   .  Anxiety    No past surgical history on file. History   Social History  . Marital Status: Married    Spouse Name: N/A    Number of Children: 1   Occupational History  . Airline pilot    Social History Main Topics  . Smoking status: Former Smoker -- 10 years    Types: Cigarettes    Quit date: 07/27/2007  . Smokeless tobacco: Never Used  . Alcohol Use: Yes     Comment: occasionally  . Drug Use: No   Current Outpatient Prescriptions on File Prior to Visit  Medication Sig Dispense Refill  . aspirin 81 MG tablet Take 81 mg by mouth daily.    . Cholecalciferol (VITAMIN D3) 5000 UNITS CAPS Take 1 capsule by mouth daily.    Marland Kitchen levonorgestrel (MIRENA) 20 MCG/24HR IUD 1 each by Intrauterine route once.    Marland Kitchen albuterol (PROVENTIL HFA;VENTOLIN HFA) 108 (90 BASE) MCG/ACT inhaler Inhale 2 puffs into the lungs every 6 (six) hours as needed for wheezing or shortness of breath. (Patient not taking: Reported on 03/17/2015) 1 Inhaler 6  . cyclobenzaprine (FLEXERIL) 5 MG tablet Take 1 tablet (5 mg total) by mouth every 8 (eight) hours as needed for muscle spasms. (Patient not taking: Reported on 03/17/2015) 30 tablet 1  . levalbuterol (XOPENEX HFA) 45 MCG/ACT inhaler Inhale 2 puffs into the lungs every 8 (eight) hours as needed for wheezing or shortness of breath. (Patient not taking: Reported on 03/17/2015) 1 Inhaler 12  .  magnesium 30 MG tablet Take 30 mg by mouth daily.     No current facility-administered medications on file prior to visit.   Allergies  Allergen Reactions  . Albuterol     jitters   Family History  Problem Relation Age of Onset  . Heart disease Maternal Grandmother   . Brain cancer Maternal Grandfather   . Breast cancer Maternal Aunt    PE: BP 118/62 mmHg  Pulse 69  Temp(Src) 98.9 F (37.2 C) (Oral)  Resp 12  Wt 168 lb (76.204 kg)  SpO2 97%  LMP 02/22/2015 Wt Readings from Last 3 Encounters:  03/17/15 168 lb (76.204 kg)  02/22/15 168 lb (76.204 kg)   10/19/14 172 lb (78.019 kg)   Constitutional: normal weight, in NAD Eyes: PERRLA, EOMI, no exophthalmos ENT: moist mucous membranes, + thyromegaly; + visible and palpable L thyroid nodule, moving with deglutition, no cervical lymphadenopathy Cardiovascular: RRR, No MRG Respiratory: CTA B Gastrointestinal: abdomen soft, NT, ND, BS+ Musculoskeletal: no deformities, strength intact in all 4;  Skin: moist, warm, no rashes Neurological: no tremor with outstretched hands, DTR normal in all 4  ASSESSMENT: 1. Large L thyroid nodule - thyroid U/S (07/2013):   Right thyroid lobe: 5.4 x 1.4 x 2.2 cm. The parenchyma of the right lobe is homogeneous.   Left thyroid lobe: 6.4 x 2.4 x 3.0 cm. A complex largely solid nodule in the left mid lower lobe of thyroid measures 3.9 x 2.3 x 2.8 cm.   Isthmus: 3 mm in thickness. No nodules visualized.   Lymphadenopathy: none   - FNA (08/2013): benign  PLAN: 1. L large thyroid nodule - Pt will have surgery for the large thyroid nodule with Dr Gerrit Friends - She c/o some SOB from the nodule at last visit, but the flow-loop curve was normal, however, feels taht her nodule is larger >> wants to have it taken out - I again reviewed the images of her thyroid ultrasound along with the patient >> discussed that, since the R lobe appears normal, I would only recommend L lobectomy  - I explained that she might have a risk of ~25% of becoming hypothyroid after hemithyroidectomy.  - we did discuss about how to take LT4 correctly if she ends up having total thyroidectomy: Take the thyroid hormone every day, with water, >30 minutes before breakfast, separated by >4 hours from acid reflux medications, calcium, iron, multivitamins. - she c/o various sxs - see ROS >> would want me to check Thyroid Abx Orders Placed This Encounter  Procedures  . TSH  . T4, free  . T3, free  . Thyroid Peroxidase Antibody  . Thyroglobulin antibody  - I'll see her back in 3 mo, but have her  come back for labs at 5 weeks after the surgery  Office Visit on 03/17/2015  Component Date Value Ref Range Status  . Thyroperoxidase Ab SerPl-aCnc 03/17/2015 2  <9 IU/mL Final  . Thyroglobulin Ab 03/17/2015 <1  <2 IU/mL Final   Thyroid ABs not elevated >> no sign of Hashimoto's thyroiditis.

## 2015-03-17 NOTE — Patient Instructions (Signed)
Please stop at the lab.  Come back for labs in 5 weeks after the surgery.  Please come back for a follow-up appointment in 3 months.

## 2015-03-18 LAB — THYROID PEROXIDASE ANTIBODY: Thyroperoxidase Ab SerPl-aCnc: 2 IU/mL (ref <9)

## 2015-03-18 LAB — THYROGLOBULIN ANTIBODY: Thyroglobulin Ab: <1 IU/mL (ref <2)

## 2015-03-20 ENCOUNTER — Ambulatory Visit: Payer: Self-pay | Admitting: Surgery

## 2015-03-28 ENCOUNTER — Encounter: Payer: Self-pay | Admitting: Physician Assistant

## 2015-05-01 NOTE — Pre-Procedure Instructions (Signed)
   Jennifer Ponce  05/01/2015       Your procedure is scheduled on : Thursday May 11, 2015 at 7:30 AM.  Report to [facility] Admitting at 5:30 A.M.  Call this number if you have problems the morning of surgery: [phone]    Remember:  Do not eat food or drink liquids after midnight.  Take these medicines the morning of surgery with A SIP OF WATER: Albuterol inhaler if needed, Xopenex inhaler if needed   Stop taking any aspirin, herbal medications, supplements, etc on Saturday July 30th   Do not wear jewelry, make-up or nail polish.  Do not wear lotions, powders, or perfumes.    Do not shave 48 hours prior to surgery.    Do not bring valuables to the hospital.  [facility] is not responsible for any belongings or valuables.  Contacts, dentures or bridgework may not be worn into surgery.  Leave your suitcase in the car.  After surgery it may be brought to your room.  For patients admitted to the hospital, discharge time will be determined by your treatment team.  Patients discharged the day of surgery will not be allowed to drive home.   Name and phone number of your driver:    Special instructions: Shower using CHG soap the night before and the morning of your surgery  Please read over the following fact sheets that you were given. Pain Booklet, Coughing and Deep Breathing and Surgical Site Infection Prevention

## 2015-05-02 ENCOUNTER — Encounter (HOSPITAL_COMMUNITY): Payer: Self-pay

## 2015-05-02 ENCOUNTER — Encounter (HOSPITAL_COMMUNITY)
Admission: RE | Admit: 2015-05-02 | Discharge: 2015-05-02 | Disposition: A | Discharge status: Home / Self Care | Payer: BLUE CROSS/BLUE SHIELD | Source: Ambulatory Visit | Attending: Surgery | Admitting: Surgery

## 2015-05-02 DIAGNOSIS — R0600 Dyspnea, unspecified: Secondary | ICD-10-CM | POA: Diagnosis not present

## 2015-05-02 DIAGNOSIS — I498 Other specified cardiac arrhythmias: Secondary | ICD-10-CM | POA: Insufficient documentation

## 2015-05-02 DIAGNOSIS — Z01818 Encounter for other preprocedural examination: Secondary | ICD-10-CM | POA: Diagnosis not present

## 2015-05-02 DIAGNOSIS — R011 Cardiac murmur, unspecified: Secondary | ICD-10-CM | POA: Insufficient documentation

## 2015-05-02 DIAGNOSIS — R509 Fever, unspecified: Secondary | ICD-10-CM | POA: Diagnosis not present

## 2015-05-02 DIAGNOSIS — R05 Cough: Secondary | ICD-10-CM | POA: Insufficient documentation

## 2015-05-02 DIAGNOSIS — Z01812 Encounter for preprocedural laboratory examination: Secondary | ICD-10-CM | POA: Diagnosis not present

## 2015-05-02 HISTORY — DX: Cardiac murmur, unspecified: R01.1

## 2015-05-02 HISTORY — DX: Pneumonia, unspecified organism: J18.9

## 2015-05-02 HISTORY — DX: Bronchitis, not specified as acute or chronic: J40

## 2015-05-02 LAB — BASIC METABOLIC PANEL
Anion gap: 5 (ref 5–15)
BUN: 8 mg/dL (ref 6–20)
CO2: 27 mmol/L (ref 22–32)
CREATININE: 0.75 mg/dL (ref 0.44–1.00)
Calcium: 9.1 mg/dL (ref 8.9–10.3)
Chloride: 103 mmol/L (ref 101–111)
GFR calc Af Amer: >60 mL/min (ref >60)
GFR calc non Af Amer: >60 mL/min (ref >60)
GLUCOSE: 89 mg/dL (ref 65–99)
Potassium: 4.1 mmol/L (ref 3.5–5.1)
Sodium: 135 mmol/L (ref 135–145)

## 2015-05-02 LAB — CBC
HCT: 37.7 % (ref 36.0–46.0)
HEMOGLOBIN: 12.8 g/dL (ref 12.0–15.0)
MCH: 31.6 pg (ref 26.0–34.0)
MCHC: 34 g/dL (ref 30.0–36.0)
MCV: 93.1 fL (ref 78.0–100.0)
PLATELETS: 230 10*3/uL (ref 150–400)
RBC: 4.05 MIL/uL (ref 3.87–5.11)
RDW: 13.2 % (ref 11.5–15.5)
WBC: 4.9 10*3/uL (ref 4.0–10.5)

## 2015-05-02 LAB — HCG, SERUM, QUALITATIVE: Preg, Serum: NEGATIVE

## 2015-05-02 NOTE — Progress Notes (Signed)
PCP is Lucky Cowboy. Patient denied having any acute cardiac issues, but informed Nurse that she was born with a heart murmur and has not had any recent heart studies done. Patient stated "my doctor said he can barely hear it now so...." Patient also informed Nurse that she last used Albuterol last month. Patient stated "I use the inhaler as needed for allergies, I don't have asthma or anything....my lungs are clear."

## 2015-05-10 ENCOUNTER — Encounter (HOSPITAL_COMMUNITY): Payer: Self-pay | Admitting: Surgery

## 2015-05-10 NOTE — H&P (Signed)
  General Surgery [facility]  Shawn Route. Steinmeyer DOB: December 13, 1980 Married / Language: English / Race: White Female  History of Present Illness  The patient is a 34 year old female who presents with a thyroid nodule. Patient is referred by Quentin Mulling and Dr. Carlus Pavlov for evaluation of left thyroid nodule. Patient was first noted approximately 2 years ago with a left thyroid nodule. This was at the time of evaluation for shortness of breath. Patient underwent ultrasound showing a 3.9 cm complex mass in the left thyroid lobe. She underwent fine-needle aspiration biopsy with benign cytopathology. Patient has had compressive symptoms including shortness of breath, globus sensation, and intermittent hoarseness. She feels as though the nodule has increased in size over the past 2 years. She has never been on thyroid medication. She has had no prior head or neck surgery. There is no family history of thyroid disease or other endocrine neoplasms. Patient presents today to consider left thyroid lobectomy for definitive diagnosis and for management of compressive symptoms.   Diagnostic Studies History Colonoscopy never  Allergies No Known Drug Allergies06/13/2016  Medication History Albuterol Sulfate HFA (108MCG/ACT Aerosol Soln, Inhalation as needed) Active. Aspirin EC (  Tablet DR, Oral daily) Active. Flexeril (  Tablet, Oral as needed) Active. Mirena (20MCG/24HR IUD, Intrauterine) Active. Medications Reconciled  Review of Systems General Not Present- Appetite Loss.   Vitals Weight: 168.6 lb Height: 66.5in Body Surface Area: 1.89 m Body Mass Index: 26.8 kg/m Temp.: 98.73F  Pulse: 64 (Regular)  BP: 104/60 (Sitting, Left Arm, Standard)  Physical Exam  General - appears comfortable, no distress; not diaphorectic  HEENT - normocephalic; sclerae clear, gaze conjugate; mucous membranes moist, dentition good; voice  normal  Neck - asymmetric on extension; no palpable anterior or posterior cervical adenopathy; dominant rounded firm mass occupying mediated and inferior portion of left thyroid lobe, approximately 4 cm in greatest dimension, mobile, nontender; right thyroid lobe without palpable abnormality  Chest - clear bilaterally without rhonchi, rales, or wheeze  Cor - regular rhythm with normal rate; no significant murmur  Ext - non-tender without significant edema or lymphedema  Neuro - grossly intact; no tremor    Assessment & Plan  THYROID NODULE (241.0  E04.1)  Patient presents for evaluation of dominant left thyroid nodule with compressive symptoms. I provided her with written literature to review at home.  Patient has a dominant complex mass measuring at least 3.9 cm in the left thyroid lobe. She is experiencing compressive symptoms with hoarseness, globus sensation, and shortness of breath. I have recommended left thyroid lobectomy. We have discussed the risk and benefits of the procedure including the potential for recurrent laryngeal nerve injury or injury to parathyroid glands. We have discussed the size of the surgical incision. We have discussed the hospital stay and the postoperative recovery and return to work. We have discussed the possibility of thyroid hormone replacement. Patient understands and wishes to proceed with surgery in the near future.  The risks and benefits of the procedure have been discussed at length with the patient. The patient understands the proposed procedure, potential alternative treatments, and the course of recovery to be expected. All of the patient's questions have been answered at this time. The patient wishes to proceed with surgery.  Velora Heckler, MD, FACS General & Endocrine Surgery [facility] Office: [phone]

## 2015-05-10 NOTE — Addendum Note (Signed)
Addended by: Quentin Mulling R on: 05/10/2015 01:53 PM   Modules accepted: Kipp Brood

## 2015-05-10 NOTE — Anesthesia Preprocedure Evaluation (Addendum)
Anesthesia Evaluation  Patient identified by MRN, date of birth, ID band Patient awake    Reviewed: Allergy & Precautions, NPO status , Patient's Chart, lab work & pertinent test results, reviewed documented beta blocker date and time   Airway Mallampati: II   Neck ROM: Full    Dental  (+) Teeth Intact, Dental Advisory Given   Pulmonary former smoker (quit 2008),  breath sounds clear to auscultation        Cardiovascular Rhythm:Regular  EKG OK   Neuro/Psych Anxiety Depression negative neurological ROS     GI/Hepatic negative GI ROS, Neg liver ROS,   Endo/Other  negative endocrine ROS  Renal/GU negative Renal ROS     Musculoskeletal   Abdominal (+)  Abdomen: soft.    Peds  Hematology 12/38   Anesthesia Other Findings   Reproductive/Obstetrics                            Anesthesia Physical Anesthesia Plan  ASA: II  Anesthesia Plan: General   Post-op Pain Management:    Induction: Intravenous  Airway Management Planned: Oral ETT  Additional Equipment:   Intra-op Plan:   Post-operative Plan: Extubation in OR  Informed Consent: I have reviewed the patients History and Physical, chart, labs and discussed the procedure including the risks, benefits and alternatives for the proposed anesthesia with the patient or authorized representative who has indicated his/her understanding and acceptance.     Plan Discussed with:   Anesthesia Plan Comments:         Anesthesia Quick Evaluation

## 2015-05-11 ENCOUNTER — Ambulatory Visit (HOSPITAL_COMMUNITY): Payer: BLUE CROSS/BLUE SHIELD | Admitting: Anesthesiology

## 2015-05-11 ENCOUNTER — Encounter (HOSPITAL_COMMUNITY)
Admission: RE | Disposition: A | Discharge status: Home / Self Care | Payer: Self-pay | Source: Ambulatory Visit | Attending: Surgery

## 2015-05-11 ENCOUNTER — Observation Stay (HOSPITAL_COMMUNITY)
Admission: RE | Admit: 2015-05-11 | Discharge: 2015-05-12 | Disposition: A | Discharge status: Home / Self Care | Payer: BLUE CROSS/BLUE SHIELD | Source: Ambulatory Visit | Attending: Surgery | Admitting: Surgery

## 2015-05-11 ENCOUNTER — Encounter (HOSPITAL_COMMUNITY): Payer: Self-pay | Admitting: General Practice

## 2015-05-11 DIAGNOSIS — F329 Major depressive disorder, single episode, unspecified: Secondary | ICD-10-CM | POA: Insufficient documentation

## 2015-05-11 DIAGNOSIS — Z7982 Long term (current) use of aspirin: Secondary | ICD-10-CM | POA: Insufficient documentation

## 2015-05-11 DIAGNOSIS — F419 Anxiety disorder, unspecified: Secondary | ICD-10-CM | POA: Diagnosis not present

## 2015-05-11 DIAGNOSIS — Z87891 Personal history of nicotine dependence: Secondary | ICD-10-CM | POA: Insufficient documentation

## 2015-05-11 DIAGNOSIS — Z79899 Other long term (current) drug therapy: Secondary | ICD-10-CM | POA: Insufficient documentation

## 2015-05-11 DIAGNOSIS — E041 Nontoxic single thyroid nodule: Secondary | ICD-10-CM | POA: Diagnosis present

## 2015-05-11 HISTORY — PX: THYROIDECTOMY: SHX17

## 2015-05-11 HISTORY — DX: Nontoxic single thyroid nodule: E04.1

## 2015-05-11 HISTORY — PX: THYROID LOBECTOMY: SHX420

## 2015-05-11 SURGERY — THYROIDECTOMY
Anesthesia: General | Site: Neck

## 2015-05-11 MED ORDER — NEOSTIGMINE METHYLSULFATE 10 MG/10ML IV SOLN
INTRAVENOUS | Status: DC | PRN
  Administered 2015-05-11: 2.5 mg via INTRAVENOUS

## 2015-05-11 MED ORDER — DEXAMETHASONE SODIUM PHOSPHATE 10 MG/ML IJ SOLN
INTRAMUSCULAR | Status: AC
  Filled 2015-05-11: qty 1

## 2015-05-11 MED ORDER — FENTANYL CITRATE (PF) 100 MCG/2ML IJ SOLN
INTRAMUSCULAR | Status: AC
  Filled 2015-05-11: qty 2

## 2015-05-11 MED ORDER — ONDANSETRON HCL 4 MG/2ML IJ SOLN: 4.0000 mg | Freq: Four times a day (QID) | INTRAMUSCULAR | Status: DC | PRN

## 2015-05-11 MED ORDER — LIDOCAINE HCL (CARDIAC) 20 MG/ML IV SOLN
INTRAVENOUS | Status: AC
  Filled 2015-05-11: qty 5

## 2015-05-11 MED ORDER — LIDOCAINE HCL (CARDIAC) 20 MG/ML IV SOLN
INTRAVENOUS | Status: DC | PRN
  Administered 2015-05-11: 90 mg via INTRAVENOUS

## 2015-05-11 MED ORDER — HYDROMORPHONE HCL 1 MG/ML IJ SOLN
INTRAMUSCULAR | Status: AC
  Filled 2015-05-11: qty 1

## 2015-05-11 MED ORDER — HYDROCODONE-ACETAMINOPHEN 5-325 MG PO TABS
ORAL_TABLET | ORAL | Status: AC
  Filled 2015-05-11: qty 2

## 2015-05-11 MED ORDER — HYDROMORPHONE HCL 1 MG/ML IJ SOLN: 1.0000 mg | INTRAMUSCULAR | Status: DC | PRN

## 2015-05-11 MED ORDER — ONDANSETRON 4 MG PO TBDP
4.0000 mg | ORAL_TABLET | Freq: Four times a day (QID) | ORAL | Status: DC | PRN
  Filled 2015-05-11: qty 1

## 2015-05-11 MED ORDER — MEPERIDINE HCL 25 MG/ML IJ SOLN: 6.2500 mg | INTRAMUSCULAR | Status: DC | PRN

## 2015-05-11 MED ORDER — CEFAZOLIN SODIUM-DEXTROSE 2-3 GM-% IV SOLR
INTRAVENOUS | Status: AC
  Filled 2015-05-11: qty 50

## 2015-05-11 MED ORDER — PROMETHAZINE HCL 25 MG/ML IJ SOLN
6.2500 mg | INTRAMUSCULAR | Status: DC | PRN
  Administered 2015-05-11: 6.25 mg via INTRAVENOUS

## 2015-05-11 MED ORDER — PROMETHAZINE HCL 25 MG/ML IJ SOLN
INTRAMUSCULAR | Status: AC
  Filled 2015-05-11: qty 1

## 2015-05-11 MED ORDER — LACTATED RINGERS IV SOLN
INTRAVENOUS | Status: DC | PRN
  Administered 2015-05-11 (×2): via INTRAVENOUS

## 2015-05-11 MED ORDER — 0.9 % SODIUM CHLORIDE (POUR BTL) OPTIME
TOPICAL | Status: DC | PRN
  Administered 2015-05-11: 1000 mL

## 2015-05-11 MED ORDER — ROCURONIUM BROMIDE 100 MG/10ML IV SOLN
INTRAVENOUS | Status: DC | PRN
  Administered 2015-05-11: 35 mg via INTRAVENOUS

## 2015-05-11 MED ORDER — HEMOSTATIC AGENTS (NO CHARGE) OPTIME
TOPICAL | Status: DC | PRN
  Administered 2015-05-11: 1 via TOPICAL

## 2015-05-11 MED ORDER — FENTANYL CITRATE (PF) 100 MCG/2ML IJ SOLN
25.0000 ug | INTRAMUSCULAR | Status: DC | PRN
  Administered 2015-05-11: 50 ug via INTRAVENOUS

## 2015-05-11 MED ORDER — MIDAZOLAM HCL 5 MG/5ML IJ SOLN
INTRAMUSCULAR | Status: DC | PRN
  Administered 2015-05-11: 2 mg via INTRAVENOUS

## 2015-05-11 MED ORDER — HYDROCODONE-ACETAMINOPHEN 5-325 MG PO TABS
1.0000 | ORAL_TABLET | ORAL | Status: DC | PRN
  Administered 2015-05-11 – 2015-05-12 (×6): 2 via ORAL
  Filled 2015-05-11 (×5): qty 2

## 2015-05-11 MED ORDER — ACETAMINOPHEN 325 MG PO TABS
650.0000 mg | ORAL_TABLET | Freq: Four times a day (QID) | ORAL | Status: DC | PRN
Start: 2015-05-11 — End: 2015-05-12

## 2015-05-11 MED ORDER — ONDANSETRON HCL 4 MG/2ML IJ SOLN
INTRAMUSCULAR | Status: AC
  Filled 2015-05-11: qty 6

## 2015-05-11 MED ORDER — ONDANSETRON HCL 4 MG/2ML IJ SOLN
INTRAMUSCULAR | Status: DC | PRN
  Administered 2015-05-11 (×2): 4 mg via INTRAVENOUS

## 2015-05-11 MED ORDER — CEFAZOLIN SODIUM-DEXTROSE 2-3 GM-% IV SOLR
2.0000 g | INTRAVENOUS | Status: AC
  Administered 2015-05-11: 2 g via INTRAVENOUS

## 2015-05-11 MED ORDER — ACETAMINOPHEN 650 MG RE SUPP: 650.0000 mg | Freq: Four times a day (QID) | RECTAL | Status: DC | PRN

## 2015-05-11 MED ORDER — PROPOFOL 10 MG/ML IV BOLUS
INTRAVENOUS | Status: AC
  Filled 2015-05-11: qty 20

## 2015-05-11 MED ORDER — KCL IN DEXTROSE-NACL 20-5-0.45 MEQ/L-%-% IV SOLN
INTRAVENOUS | Status: DC
Start: 2015-05-11 — End: 2015-05-12
  Administered 2015-05-11: 15:00:00 via INTRAVENOUS
  Filled 2015-05-11 (×2): qty 1000

## 2015-05-11 MED ORDER — SUCCINYLCHOLINE CHLORIDE 20 MG/ML IJ SOLN
INTRAMUSCULAR | Status: AC
  Filled 2015-05-11: qty 2

## 2015-05-11 MED ORDER — ARTIFICIAL TEARS OP OINT
TOPICAL_OINTMENT | OPHTHALMIC | Status: DC | PRN
  Administered 2015-05-11: 1 via OPHTHALMIC

## 2015-05-11 MED ORDER — ARTIFICIAL TEARS OP OINT
TOPICAL_OINTMENT | OPHTHALMIC | Status: AC
  Filled 2015-05-11: qty 3.5

## 2015-05-11 MED ORDER — MIDAZOLAM HCL 2 MG/2ML IJ SOLN
INTRAMUSCULAR | Status: AC
  Filled 2015-05-11: qty 4

## 2015-05-11 MED ORDER — FENTANYL CITRATE (PF) 100 MCG/2ML IJ SOLN
INTRAMUSCULAR | Status: DC | PRN
  Administered 2015-05-11: 100 ug via INTRAVENOUS
  Administered 2015-05-11 (×3): 50 ug via INTRAVENOUS

## 2015-05-11 MED ORDER — PROPOFOL 10 MG/ML IV BOLUS
INTRAVENOUS | Status: DC | PRN
  Administered 2015-05-11: 160 mg via INTRAVENOUS

## 2015-05-11 MED ORDER — DEXAMETHASONE SODIUM PHOSPHATE 10 MG/ML IJ SOLN
INTRAMUSCULAR | Status: DC | PRN
  Administered 2015-05-11: 10 mg via INTRAVENOUS

## 2015-05-11 MED ORDER — FENTANYL CITRATE (PF) 250 MCG/5ML IJ SOLN
INTRAMUSCULAR | Status: AC
  Filled 2015-05-11: qty 5

## 2015-05-11 MED ORDER — GLYCOPYRROLATE 0.2 MG/ML IJ SOLN
INTRAMUSCULAR | Status: DC | PRN
  Administered 2015-05-11: 0.3 mg via INTRAVENOUS

## 2015-05-11 SURGICAL SUPPLY — 54 items
APL SKNCLS STERI-STRIP NONHPOA (GAUZE/BANDAGES/DRESSINGS) ×1
ATTRACTOMAT 16X20 MAGNETIC DRP (DRAPES) ×3 IMPLANT
BENZOIN TINCTURE PRP APPL 2/3 (GAUZE/BANDAGES/DRESSINGS) ×3 IMPLANT
BLADE SURG 10 STRL SS (BLADE) ×3 IMPLANT
BLADE SURG 15 STRL LF DISP TIS (BLADE) ×1 IMPLANT
BLADE SURG 15 STRL SS (BLADE) ×3
BLADE SURG ROTATE 9660 (MISCELLANEOUS) IMPLANT
CANISTER SUCTION 2500CC (MISCELLANEOUS) ×3 IMPLANT
CHLORAPREP W/TINT 10.5 ML (MISCELLANEOUS) ×3 IMPLANT
CLIP TI MEDIUM 24 (CLIP) ×3 IMPLANT
CLIP TI WIDE RED SMALL 24 (CLIP) ×3 IMPLANT
CLOSURE WOUND 1/2 X4 (GAUZE/BANDAGES/DRESSINGS) ×1
CONT SPEC 4OZ CLIKSEAL STRL BL (MISCELLANEOUS) IMPLANT
COVER SURGICAL LIGHT HANDLE (MISCELLANEOUS) ×3 IMPLANT
CRADLE DONUT ADULT HEAD (MISCELLANEOUS) ×3 IMPLANT
DRAPE PED LAPAROTOMY (DRAPES) ×3 IMPLANT
DRAPE UTILITY XL STRL (DRAPES) ×3 IMPLANT
ELECT CAUTERY BLADE 6.4 (BLADE) ×3 IMPLANT
ELECT REM PT RETURN 9FT ADLT (ELECTROSURGICAL) ×3
ELECTRODE REM PT RTRN 9FT ADLT (ELECTROSURGICAL) ×1 IMPLANT
GAUZE SPONGE 4X4 12PLY STRL (GAUZE/BANDAGES/DRESSINGS) ×3 IMPLANT
GAUZE SPONGE 4X4 16PLY XRAY LF (GAUZE/BANDAGES/DRESSINGS) ×3 IMPLANT
GLOVE BIO SURGEON STRL SZ7 (GLOVE) ×2 IMPLANT
GLOVE BIO SURGEON STRL SZ7.5 (GLOVE) ×2 IMPLANT
GLOVE BIOGEL PI IND STRL 7.0 (GLOVE) IMPLANT
GLOVE BIOGEL PI IND STRL 7.5 (GLOVE) IMPLANT
GLOVE BIOGEL PI INDICATOR 7.0 (GLOVE) ×2
GLOVE BIOGEL PI INDICATOR 7.5 (GLOVE) ×2
GLOVE SURG ORTHO 8.0 STRL STRW (GLOVE) ×3 IMPLANT
GOWN STRL REUS W/ TWL LRG LVL3 (GOWN DISPOSABLE) ×2 IMPLANT
GOWN STRL REUS W/ TWL XL LVL3 (GOWN DISPOSABLE) ×1 IMPLANT
GOWN STRL REUS W/TWL LRG LVL3 (GOWN DISPOSABLE) ×6
GOWN STRL REUS W/TWL XL LVL3 (GOWN DISPOSABLE) ×3
HEMOSTAT SURGICEL 2X4 FIBR (HEMOSTASIS) ×3 IMPLANT
KIT BASIN OR (CUSTOM PROCEDURE TRAY) ×3 IMPLANT
KIT ROOM TURNOVER OR (KITS) ×3 IMPLANT
NS IRRIG 1000ML POUR BTL (IV SOLUTION) ×3 IMPLANT
PACK SURGICAL SETUP 50X90 (CUSTOM PROCEDURE TRAY) ×3 IMPLANT
PAD ARMBOARD 7.5X6 YLW CONV (MISCELLANEOUS) ×3 IMPLANT
PENCIL BUTTON HOLSTER BLD 10FT (ELECTRODE) ×3 IMPLANT
SHEARS HARMONIC 9CM CVD (BLADE) ×3 IMPLANT
SPECIMEN JAR MEDIUM (MISCELLANEOUS) IMPLANT
SPONGE INTESTINAL PEANUT (DISPOSABLE) ×3 IMPLANT
STRIP CLOSURE SKIN 1/2X4 (GAUZE/BANDAGES/DRESSINGS) ×2 IMPLANT
SUT MNCRL AB 4-0 PS2 18 (SUTURE) ×3 IMPLANT
SUT SILK 2 0 (SUTURE) ×3
SUT SILK 2-0 18XBRD TIE 12 (SUTURE) ×1 IMPLANT
SUT VIC AB 3-0 SH 18 (SUTURE) ×5 IMPLANT
SYR BULB 3OZ (MISCELLANEOUS) ×3 IMPLANT
TAPE CLOTH SURG 4X10 WHT LF (GAUZE/BANDAGES/DRESSINGS) ×2 IMPLANT
TOWEL OR 17X24 6PK STRL BLUE (TOWEL DISPOSABLE) ×1 IMPLANT
TOWEL OR 17X26 10 PK STRL BLUE (TOWEL DISPOSABLE) ×3 IMPLANT
TUBE CONNECTING 12'X1/4 (SUCTIONS) ×1
TUBE CONNECTING 12X1/4 (SUCTIONS) ×2 IMPLANT

## 2015-05-11 NOTE — Transfer of Care (Signed)
Immediate Anesthesia Transfer of Care Note  Patient: Jennifer Ponce  Procedure(s) Performed: Procedure(s): LEFT THYROIDECTOMY LOBECTOMY (N/A)  Patient Location: PACU  Anesthesia Type:General  Level of Consciousness: awake, oriented, patient cooperative and responds to stimulation  Airway & Oxygen Therapy: Patient Spontanous Breathing and Patient connected to nasal cannula oxygen  Post-op Assessment: Report given to RN and Post -op Vital signs reviewed and stable  Post vital signs: Reviewed and stable  Last Vitals:  Filed Vitals:   05/11/15 0547  BP: 119/58  Pulse: 60  Temp: 36.6 C  Resp: 18    Complications: No apparent anesthesia complications

## 2015-05-11 NOTE — Op Note (Signed)
Procedure Note  Pre-operative Diagnosis:  Left thyroid nodule  Post-operative Diagnosis:  same  Surgeon:  Velora Heckler, MD, FACS  Assistant:  Magnus Ivan, RNFA   Procedure:  Left thyroid lobectomy  Anesthesia:  General  Estimated Blood Loss:  minimal  Drains: none         Specimen: thyroid lobe to pathology  Indications:  The patient is a 34 year old female who presents with a thyroid nodule. Patient is referred by Quentin Mulling and Dr. Carlus Pavlov for evaluation of left thyroid nodule. Patient was first noted approximately 2 years ago with a left thyroid nodule. This was at the time of evaluation for shortness of breath. Patient underwent ultrasound showing a 3.9 cm complex mass in the left thyroid lobe. She underwent fine-needle aspiration biopsy with benign cytopathology. Patient has had compressive symptoms including shortness of breath, globus sensation, and intermittent hoarseness. She feels as though the nodule has increased in size over the past 2 years.   Procedure Details: Procedure was done in OR #2 at the [facility].  The patient was brought to the operating room and placed in a supine position on the operating room table.  Following administration of general anesthesia, the patient was positioned and then prepped and draped in the usual aseptic fashion.  After ascertaining that an adequate level of anesthesia had been achieved, a Kocher incision was made with #15 blade.  Dissection was carried through subcutaneous tissues and platysma. Hemostasis was achieved with the electrocautery.  Skin flaps were elevated cephalad and caudad from the thyroid notch to the sternal notch.  A self-retaining retractor was placed for exposure.  Strap muscles were incised in the midline and dissection was begun on the left side.  Strap muscles were reflected laterally.  The left thyroid lobe was markedly enlarged with a large nodule in the inferior pole.  The lobe was gently  mobilized with blunt dissection.  Superior pole vessels were dissected out and divided individually between small and medium Ligaclips with the Harmonic scalpel.  The thyroid lobe was rolled anteriorly.  Branches of the inferior thyroid artery were divided between small Ligaclips with the Harmonic scalpel.  Inferior venous tributaries were divided between Ligaclips.  Both the superior and inferior parathyroid glands were identified and preserved on their vascular pedicles.  The recurrent laryngeal nerve was identified and preserved along its course.  The ligament of Allyson Sabal was released with the electrocautery and the gland was mobilized onto the anterior trachea. Isthmus was mobilized across the midline.  There was moderate sized pyramidal lobe present which was resected with the thyroid isthmus.  The thyroid parenchyma was transected at the junction of the isthmus and contralateral thyroid lobe with the Harmonic scalpel.  The thyroid lobe and isthmus were submitted to pathology for review.  The neck was irrigated with warm saline.  Fibular was placed throughout the operative field.  Strap muscles were reapproximated in the midline with interrupted 3-0 Vicryl sutures.  Platysma was closed with interrupted 3-0 Vicryl sutures.  Skin was closed with a running 4-0 Monocryl subcuticular suture.  Wound was washed and dried and benzoin and steri-strips were applied.  Dry gauze dressing was placed.  The patient was awakened from anesthesia and brought to the recovery room.  The patient tolerated the procedure well.   Velora Heckler, MD, FACS General & Endocrine Surgery [facility] Office: [phone]

## 2015-05-11 NOTE — Anesthesia Postprocedure Evaluation (Signed)
  Anesthesia Post-op Note  Patient: Jennifer Ponce  Procedure(s) Performed: Procedure(s): LEFT THYROIDECTOMY LOBECTOMY (N/A)  Patient Location: PACU  Anesthesia Type:General  Level of Consciousness: awake and alert   Airway and Oxygen Therapy: Patient Spontanous Breathing and Patient connected to nasal cannula oxygen  Post-op Pain: mild  Post-op Assessment: Post-op Vital signs reviewed, Patient's Cardiovascular Status Stable, Respiratory Function Stable, Patent Airway and No signs of Nausea or vomiting              Post-op Vital Signs: Reviewed and stable  Last Vitals:  Filed Vitals:   05/11/15 0915  BP: 129/85  Pulse: 71  Temp:   Resp: 17    Complications: No apparent anesthesia complications

## 2015-05-11 NOTE — Interval H&P Note (Signed)
History and Physical Interval Note:  05/11/2015 7:10 AM  Jennifer Ponce  has presented today for surgery, with the diagnosis of LEFT THYROID NODULE.  The various methods of treatment have been discussed with the patient and family. After consideration of risks, benefits and other options for treatment, the patient has consented to    Procedure(s): LEFT THYROIDECTOMY LOBECTOMY (N/A) as a surgical intervention .    The patient's history has been reviewed, patient examined, no change in status, stable for surgery.  I have reviewed the patient's chart and labs.  Questions were answered to the patient's satisfaction.    Velora Heckler, MD, FACS General & Endocrine Surgery [facility] Office: [phone]   [name]

## 2015-05-11 NOTE — Anesthesia Procedure Notes (Signed)
Procedure Name: Intubation Date/Time: 05/11/2015 7:36 AM Performed by: Wray Kearns A Pre-anesthesia Checklist: Patient identified, Timeout performed, Emergency Drugs available, Suction available and Patient being monitored Patient Re-evaluated:Patient Re-evaluated prior to inductionOxygen Delivery Method: Circle system utilized Preoxygenation: Pre-oxygenation with 100% oxygen Intubation Type: IV induction and Cricoid Pressure applied Ventilation: Mask ventilation without difficulty Laryngoscope Size: Mac and 4 Grade View: Grade I Tube type: Oral Tube size: 8.0 mm Number of attempts: 1 Airway Equipment and Method: Stylet Placement Confirmation: ETT inserted through vocal cords under direct vision,  breath sounds checked- equal and bilateral and positive ETCO2 Secured at: 21 cm Tube secured with: Tape Dental Injury: Teeth and Oropharynx as per pre-operative assessment

## 2015-05-12 ENCOUNTER — Encounter (HOSPITAL_COMMUNITY): Payer: Self-pay | Admitting: Surgery

## 2015-05-12 DIAGNOSIS — E041 Nontoxic single thyroid nodule: Secondary | ICD-10-CM | POA: Diagnosis not present

## 2015-05-12 LAB — BASIC METABOLIC PANEL
ANION GAP: 6 (ref 5–15)
BUN: 6 mg/dL (ref 6–20)
CO2: 28 mmol/L (ref 22–32)
Calcium: 8.5 mg/dL — ABNORMAL LOW (ref 8.9–10.3)
Chloride: 105 mmol/L (ref 101–111)
Creatinine, Ser: 0.67 mg/dL (ref 0.44–1.00)
Glucose, Bld: 118 mg/dL — ABNORMAL HIGH (ref 65–99)
POTASSIUM: 3.9 mmol/L (ref 3.5–5.1)
Sodium: 139 mmol/L (ref 135–145)

## 2015-05-12 MED ORDER — HYDROCODONE-ACETAMINOPHEN 5-325 MG PO TABS: 1.0000 | ORAL_TABLET | ORAL | Status: DC | PRN

## 2015-05-12 NOTE — Discharge Summary (Signed)
Physician Discharge Summary [facility]  Patient ID: Jennifer Ponce MRN: [id number] DOB/AGE: 1981/01/27 34 y.o.  Admit date: 05/11/2015 Discharge date: 05/12/2015  Admission Diagnoses:  Thyroid nodule  Discharge Diagnoses:  Principal Problem:   Thyroid nodule   Discharged Condition: good  Hospital Course: Patient was admitted for observation following thyroid surgery.  Post op course was uncomplicated.  Pain was well controlled.  Tolerated diet.  Patient was prepared for discharge home on POD#1.  Consults: None  Treatments: surgery: left thyroid lobectomy  Discharge Exam: Blood pressure 102/72, pulse 62, temperature 98.3 F (36.8 C), temperature source Oral, resp. rate 18, height 5\' 7"  (1.702 m), weight 75.297 kg (166 lb), last menstrual period 05/02/2015, SpO2 100 %. HEENT - clear Neck - wound dry and intact; slight hoarseness of voice Chest - clear bilaterally Cor - RRR  Disposition: Home  Discharge Instructions    Apply dressing    Complete by:  As directed   Apply light gauze dressing to wound before discharge home today.     Diet - low sodium heart healthy    Complete by:  As directed      Discharge instructions    Complete by:  As directed   [facility] PARATHYROID SURGERY:  POST-OP INSTRUCTIONS  Always review your discharge instruction sheet from the facility where your surgery was performed.  A prescription for pain medication may be given to you upon discharge.  Take your pain medication as prescribed.  If narcotic pain medicine is not needed, then you may take acetaminophen (Tylenol) or ibuprofen (Advil) as needed.  Take your usually prescribed medications unless otherwise directed.  If you need a refill on your pain medication, please contact your pharmacy. They will contact our office to request authorization.  Prescriptions will not be processed by our office after 5 pm or on weekends.  Start with a  light diet upon arrival home, such as soup and crackers or toast.  Be sure to drink plenty of fluids daily.  Resume your normal diet the day after surgery.  Most patients will experience some swelling and bruising on the chest and neck area.  Ice packs will help.  Swelling and bruising can take several days to resolve.   It is common to experience some constipation after surgery.  Increasing fluid intake and taking a stool softener will usually help or prevent this problem.  A mild laxative (Milk of Magnesia or Miralax) should be taken according to package directions if there has been no bowel movement after 48 hours.  You have steri-strips and a gauze dressing over your incision.  You may remove the gauze bandage on the second day after surgery, and you may shower at that time.  Leave your steri-strips (small skin tapes) in place directly over the incision.  These strips should remain on the skin for 5-7 days and then be removed.  You may get them wet in the shower and pat them dry.  You may resume regular (light) daily activities beginning the next day - such as daily self-care, walking, climbing stairs - gradually increasing activities as tolerated.  You may have sexual intercourse when it is comfortable.  Refrain from any heavy lifting or straining until approved by your doctor.  You may drive when you no longer are taking prescription pain medication, you can comfortably wear a seatbelt, and you can safely maneuver your car and apply brakes.  You should see your doctor in  the office for a follow-up appointment approximately two to three weeks after your surgery.  Make sure that you call for this appointment within a day or two after you arrive home to insure a convenient appointment time.  WHEN TO CALL YOUR DOCTOR: -- Fever greater than 101.5 -- Inability to urinate -- Nausea and/or vomiting - persistent -- Extreme swelling or bruising -- Continued bleeding from incision -- Increased pain,  redness, or drainage from the incision -- Difficulty swallowing or breathing -- Muscle cramping or spasms -- Numbness or tingling in hands or around lips  The clinic staff is available to answer your questions during regular business hours.  Please don't hesitate to call and ask to speak to one of the nurses if you have concerns.  Velora Heckler, MD, FACS General & Endocrine Surgery [facility] Office: [phone]  Website: [email or web address]     Ice pack    Complete by:  As directed      Increase activity slowly    Complete by:  As directed      Remove dressing in 24 hours    Complete by:  As directed             Medication List    TAKE these medications        albuterol 108 (90 BASE) MCG/ACT inhaler  Commonly known as:  PROVENTIL HFA;VENTOLIN HFA  Inhale 2 puffs into the lungs every 6 (six) hours as needed for wheezing or shortness of breath.     HYDROcodone-acetaminophen 5-325 MG per tablet  Commonly known as:  NORCO/VICODIN  Take 1-2 tablets by mouth every 4 (four) hours as needed for moderate pain.     levalbuterol 45 MCG/ACT inhaler  Commonly known as:  XOPENEX HFA  Inhale 2 puffs into the lungs every 8 (eight) hours as needed for wheezing or shortness of breath.     levonorgestrel 20 MCG/24HR IUD  Commonly known as:  MIRENA  1 each by Intrauterine route once.           Follow-up Information    Follow up with Velora Heckler, MD. Schedule an appointment as soon as possible for a visit in 3 weeks.   Specialty:  General Surgery   Why:  For wound re-check   Contact information:   [address] [place] [address] [phone]       Velora Heckler, MD, [facility] Office: [phone]   Signed: Velora Heckler 05/12/2015, 11:34 AM

## 2015-05-12 NOTE — Discharge Planning (Signed)
AVS reviewed with pt who verbalizes understanding, copy to pt and pain rx. Dc to private car home with all personal belongings accompanied by husband.

## 2015-05-13 NOTE — Progress Notes (Signed)
Quick Note:  Please contact patient and notify of benign pathology results.  Paden Senger M. Cherri Yera, MD, FACS [facility] Office: [phone]   ______ 

## 2015-06-23 ENCOUNTER — Ambulatory Visit (INDEPENDENT_AMBULATORY_CARE_PROVIDER_SITE_OTHER): Payer: BLUE CROSS/BLUE SHIELD | Admitting: Internal Medicine

## 2015-06-23 ENCOUNTER — Encounter: Payer: Self-pay | Admitting: Internal Medicine

## 2015-06-23 ENCOUNTER — Other Ambulatory Visit (INDEPENDENT_AMBULATORY_CARE_PROVIDER_SITE_OTHER): Payer: BLUE CROSS/BLUE SHIELD

## 2015-06-23 VITALS — BP 102/60 | HR 79 | Temp 97.6°F | Resp 12 | Wt 168.0 lb

## 2015-06-23 DIAGNOSIS — E890 Postprocedural hypothyroidism: Secondary | ICD-10-CM | POA: Diagnosis not present

## 2015-06-23 DIAGNOSIS — Z8639 Personal history of other endocrine, nutritional and metabolic disease: Secondary | ICD-10-CM | POA: Insufficient documentation

## 2015-06-23 HISTORY — DX: Personal history of other endocrine, nutritional and metabolic disease: Z86.39

## 2015-06-23 LAB — T3, FREE: T3, Free: 3 pg/mL (ref 2.3–4.2)

## 2015-06-23 LAB — TSH: TSH: 1.3 u[IU]/mL (ref 0.35–4.50)

## 2015-06-23 LAB — T4, FREE: FREE T4: 0.91 ng/dL (ref 0.60–1.60)

## 2015-06-23 NOTE — Progress Notes (Signed)
Patient ID: Jennifer Ponce, female   DOB: Dec 09, 1980, 34 y.o.   MRN: [id number]   HPI  Jennifer Ponce is a 34 y.o.-year-old female, initially referred by her PCP, Dr. Oneta Rack, now returning after her L thyroidectomy for a large  thyroid nodulwith neck compressing sxs. Last visit 3 mo ago.  Reviewed hx: Pt noticed in 05/2013 that she had SOB with activity and heat. She was referred to Pulmonary >> PFTs normal (including flow-loop curve) >> found a thyroid nodule by palpation >> thyroid U/S (08/04/2013): large L complex thyroid nodule 3.9 x 2.3 x 2.8 cm. FNA (08/18/2013): benign.   She saw surgery, but was told her neck compression sxs likely not from nodule. She tells me now she will have sx for the nodule with Dr Gerrit Friends - meets with him on 03/20/2015.  Pt can feel her L nodule in neck (and this is also clearly visible when pt talks and moved her head), no hoarseness, no dysphagia/odynophagia, some SOB with lying down, but mostly with exertion, movement.  She had L thyroidectomy on 05/11/2015 >> feels much better. No more gasping for breath. No inhalers.   I reviewed pt's thyroid tests: Lab Results  Component Value Date   TSH 0.705 02/22/2015   TSH 0.745 08/16/2013    Pt mentions: - + fatigue - no cold intolerance - no tremors - no palpitations - no anxiety - no mm spasms - no hyperdefecation/constipation - no weight loss - no weight gain - no dry skin - no hair falling - + HAs  Pt does not have a FH of thyroid ds. No FH of thyroid cancer. No h/o radiation tx to head or neck.  ROS: Constitutional: + see HPI Eyes: no blurry vision, no xerophthalmia ENT: no sore throat, no dysphagia/odynophagia, + hoarseness Cardiovascular: no CP/SOB/palpitations/leg swelling Respiratory: no cough/SOB/wheezing Gastrointestinal: no N/V/D/C Musculoskeletal: no muscle/joint aches Skin: no rashes Neurological: no tremors/numbness/tingling/dizziness, + HA  I reviewed pt's medications,  allergies, PMH, social hx, family hx, and changes were documented in the history of present illness. Otherwise, unchanged from my initial visit note:  Past Medical History  Diagnosis Date  . Allergy   . Depression   . Anxiety   . Heart murmur     born with a heart murmur; pt stated "I havent seen a doctor for it since I was a kid"  . Pneumonia     hx of  . Bronchitis     hx of  . Thyroid nodule    Past Surgical History  Procedure Laterality Date  . Thyroid lobectomy Left 05/11/2015  . Thyroidectomy N/A 05/11/2015    Procedure: LEFT THYROIDECTOMY LOBECTOMY;  Surgeon: Darnell Level, MD;  Location: [facility];  Service: General;  Laterality: N/A;   History   Social History  . Marital Status: Married    Spouse Name: N/A    Number of Children: 1   Occupational History  . Airline pilot    Social History Main Topics  . Smoking status: Former Smoker -- 10 years    Types: Cigarettes    Quit date: 07/27/2007  . Smokeless tobacco: Never Used  . Alcohol Use: Yes     Comment: occasionally  . Drug Use: No   Current Outpatient Prescriptions on File Prior to Visit  Medication Sig Dispense Refill  . levonorgestrel (MIRENA) 20 MCG/24HR IUD 1 each by Intrauterine route once.     No current facility-administered medications on file prior to visit.   Allergies  Allergen  Reactions  . Albuterol     jitters   Family History  Problem Relation Age of Onset  . Heart disease Maternal Grandmother   . Brain cancer Maternal Grandfather   . Breast cancer Maternal Aunt    PE: BP 102/60 mmHg  Pulse 79  Temp(Src) 97.6 F (36.4 C) (Oral)  Resp 12  Wt 168 lb (76.204 kg)  SpO2 98% Body mass index is 26.31 kg/(m^2). Wt Readings from Last 3 Encounters:  06/23/15 168 lb (76.204 kg)  05/11/15 166 lb (75.297 kg)  05/02/15 166 lb 1 oz (75.325 kg)   Constitutional: normal weight, in NAD Eyes: PERRLA, EOMI, no exophthalmos ENT: moist mucous membranes, + cervical scar still a little  swollen, some dysesthesia at the site, no cervical lymphadenopathy Cardiovascular: RRR, No MRG Respiratory: CTA B Gastrointestinal: abdomen soft, NT, ND, BS+ Musculoskeletal: no deformities, strength intact in all 4;  Skin: moist, warm, no rashes Neurological: no tremor with outstretched hands, DTR normal in all 4  ASSESSMENT: 1. Large L thyroid nodule - thyroid U/S (07/2013):   Right thyroid lobe: 5.4 x 1.4 x 2.2 cm. The parenchyma of the right lobe is homogeneous.   Left thyroid lobe: 6.4 x 2.4 x 3.0 cm. A complex largely solid nodule in the left mid lower lobe of thyroid measures 3.9 x 2.3 x 2.8 cm.   Isthmus: 3 mm in thickness. No nodules visualized.   Lymphadenopathy: none  - FNA (08/2013): benign  - L thyroidectomy (05/11/2015) - Dr Gerrit Friends   PLAN: 1.  H/o L large thyroid nodule - Pt had thyroidectomy for the large thyroid nodule + neck compression sxs (with Dr Gerrit Friends) - she feels much better and feels she can breathe now - thyroid scar healing  2. Postsurgical status - we discussed that she has a risk of 25% of developing hypothyroidism after lobectomy >> will check TFTs today - we discussed about how to take LT4 correctly if she ends up needing this: Take the thyroid hormone every day, with water, >30 minutes before breakfast, separated by >4 hours from acid reflux medications, calcium, iron, multivitamins. - I'll see her back in 6 mo  Appointment on 06/23/2015  Component Date Value Ref Range Status  . TSH 06/23/2015 1.30  0.35 - 4.50 uIU/mL Final  . Free T4 06/23/2015 0.91  0.60 - 1.60 ng/dL Final  . T3, Free 16/07/9603 3.0  2.3 - 4.2 pg/mL Final   Thyroid labs normal. No signs of postsurgical hypothyroidism. We'll repeat her thyroid labs in 6 months, when she comes back.

## 2015-06-23 NOTE — Patient Instructions (Signed)
Please come back for a follow-up appointment in 6 months.  Please stop at the lab.    

## 2015-06-24 ENCOUNTER — Encounter: Payer: Self-pay | Admitting: Physician Assistant

## 2015-09-04 ENCOUNTER — Encounter: Payer: Self-pay | Admitting: Physician Assistant

## 2015-09-28 ENCOUNTER — Other Ambulatory Visit: Payer: Self-pay | Admitting: Physician Assistant

## 2015-09-28 ENCOUNTER — Telehealth: Payer: Self-pay | Admitting: Physician Assistant

## 2015-09-28 MED ORDER — PREDNISONE 20 MG PO TABS: ORAL_TABLET | ORAL | Status: DC

## 2015-09-28 MED ORDER — AZITHROMYCIN 250 MG PO TABS: ORAL_TABLET | ORAL | Status: AC

## 2015-09-28 NOTE — Telephone Encounter (Signed)
Patient calling with sinus congestion x 5 days, ear congestion, sinus pain, yellow/green mucus, cough, achy without fever/chill.  Will send in zpak/prednisone.  If not better needs appointment, if worse go to UC/ER.

## 2015-12-18 ENCOUNTER — Encounter: Payer: Self-pay | Admitting: Physician Assistant

## 2015-12-21 ENCOUNTER — Encounter: Payer: Self-pay | Admitting: Internal Medicine

## 2015-12-21 ENCOUNTER — Ambulatory Visit (INDEPENDENT_AMBULATORY_CARE_PROVIDER_SITE_OTHER): Payer: BLUE CROSS/BLUE SHIELD | Admitting: Internal Medicine

## 2015-12-21 VITALS — BP 118/66 | HR 66 | Temp 97.6°F | Resp 12 | Wt 165.0 lb

## 2015-12-21 DIAGNOSIS — E890 Postprocedural hypothyroidism: Secondary | ICD-10-CM

## 2015-12-21 DIAGNOSIS — Z9889 Other specified postprocedural states: Secondary | ICD-10-CM | POA: Insufficient documentation

## 2015-12-21 DIAGNOSIS — Z9009 Acquired absence of other part of head and neck: Secondary | ICD-10-CM

## 2015-12-21 LAB — T3, FREE: T3 FREE: 3.1 pg/mL (ref 2.3–4.2)

## 2015-12-21 LAB — TSH: TSH: 0.69 u[IU]/mL (ref 0.35–4.50)

## 2015-12-21 LAB — T4, FREE: FREE T4: 0.87 ng/dL (ref 0.60–1.60)

## 2015-12-21 NOTE — Patient Instructions (Signed)
Please come back for a follow-up appointment as needed.  Please stop at the lab.

## 2015-12-21 NOTE — Progress Notes (Signed)
Patient ID: Jennifer Ponce, female   DOB: 04-Mar-1981, 35 y.o.   MRN: [id number]   HPI  Jennifer MuskratMeredith Y Ponce is a 35 y.o.-year-old female, initially referred by her PCP, Dr. Oneta RackMcKeown, now returning after her L thyroidectomy for a large  thyroid nodulwith neck compressing sxs. Last visit 6 mo ago.  Reviewed hx: Pt noticed in 05/2013 that she had SOB with activity and heat. She was referred to Pulmonary >> PFTs normal (including flow-loop curve) >> found a thyroid nodule by palpation >> thyroid U/S (08/04/2013): large L complex thyroid nodule 3.9 x 2.3 x 2.8 cm. FNA (08/18/2013): benign.   She saw surgery, but was told her neck compression sxs likely not from nodule. She tells me now she will have sx for the nodule with Dr Gerrit FriendsGerkin - meets with him on 03/20/2015.  Pt can feel her L nodule in neck (and this is also clearly visible when pt talks and moved her head), no hoarseness, no dysphagia/odynophagia, some SOB with lying down, but mostly with exertion, movement.  She had L thyroidectomy on 05/11/2015 >> feels much better. She only had some SOB recently  -? Allergies.  No LT4 required after the surgery.   I reviewed pt's thyroid tests: Lab Results  Component Value Date   TSH 1.30 06/23/2015   TSH 0.705 02/22/2015   TSH 0.745 08/16/2013   FREET4 0.91 06/23/2015    Pt mentions: - no fatigue - no cold intolerance - no tremors - no palpitations - no anxiety - no mm spasms - no hyperdefecation/constipation - no weight loss - no weight gain - no dry skin - no hair fallings  Pt does not have a FH of thyroid ds. No FH of thyroid cancer. No h/o radiation tx to head or neck.  ROS: Constitutional: + see HPI Eyes: no blurry vision, no xerophthalmia ENT: no sore throat, no dysphagia/odynophagia, + hoarseness Cardiovascular: no CP/SOB/palpitations/leg swelling Respiratory: no cough/SOB/wheezing Gastrointestinal: no N/V/D/C Musculoskeletal: no muscle/joint aches Skin: no  rashes Neurological: no tremors/numbness/tingling/dizziness  I reviewed pt's medications, allergies, PMH, social hx, family hx, and changes were documented in the history of present illness. Otherwise, unchanged from my initial visit note:  Past Medical History  Diagnosis Date  . Allergy   . Depression   . Anxiety   . Heart murmur     born with a heart murmur; pt stated "I havent seen a doctor for it since I was a kid"  . Pneumonia     hx of  . Bronchitis     hx of  . Thyroid nodule    Past Surgical History  Procedure Laterality Date  . Thyroid lobectomy Left 05/11/2015  . Thyroidectomy N/A 05/11/2015    Procedure: LEFT THYROIDECTOMY LOBECTOMY;  Surgeon: Darnell Levelodd Gerkin, MD;  [facility];  Service: General;  Laterality: N/A;   History   Social History  . Marital Status: Married    Spouse Name: N/A    Number of Children: 1   Occupational History  . Airline pilotservice operations manager    Social History Main Topics  . Smoking status: Former Smoker -- 10 years    Types: Cigarettes    Quit date: 07/27/2007  . Smokeless tobacco: Never Used  . Alcohol Use: Yes     Comment: occasionally  . Drug Use: No   Current Outpatient Prescriptions on File Prior to Visit  Medication Sig Dispense Refill  . aspirin 81 MG chewable tablet Chew 81 mg by mouth daily.    .Marland Kitchen  levonorgestrel (MIRENA) 20 MCG/24HR IUD 1 each by Intrauterine route once.     No current facility-administered medications on file prior to visit.   Allergies  Allergen Reactions  . Albuterol     jitters   Family History  Problem Relation Age of Onset  . Heart disease Maternal Grandmother   . Brain cancer Maternal Grandfather   . Breast cancer Maternal Aunt    PE: BP 118/66 mmHg  Pulse 66  Temp(Src) 97.6 F (36.4 C) (Oral)  Resp 12  Wt 165 lb (74.844 kg)  SpO2 97% Body mass index is 25.84 kg/(m^2). Wt Readings from Last 3 Encounters:  12/21/15 165 lb (74.844 kg)  06/23/15 168 lb (76.204 kg)  05/11/15 166 lb  (75.297 kg)   Constitutional: normal weight, in NAD Eyes: PERRLA, EOMI, no exophthalmos ENT: moist mucous membranes, + R 1/2 of the cervical scar still a little swollen, no dysesthesia at the site, no cervical lymphadenopathy Cardiovascular: RRR, No MRG Respiratory: CTA B Gastrointestinal: abdomen soft, NT, ND, BS+ Musculoskeletal: no deformities, strength intact in all 4;  Skin: moist, warm, no rashes Neurological: no tremor with outstretched hands, DTR normal in all 4  ASSESSMENT: 1. Large L thyroid nodule - thyroid U/S (07/2013):   Right thyroid lobe: 5.4 x 1.4 x 2.2 cm. The parenchyma of the right lobe is homogeneous.   Left thyroid lobe: 6.4 x 2.4 x 3.0 cm. A complex largely solid nodule in the left mid lower lobe of thyroid measures 3.9 x 2.3 x 2.8 cm.   Isthmus: 3 mm in thickness. No nodules visualized.   Lymphadenopathy: none  - FNA (08/2013): benign  - L thyroidectomy (05/11/2015) - Dr Gerrit Friends   PLAN: 1.  H/o L large thyroid nodule - Pt had thyroidectomy for the large thyroid nodule + neck compression sxs (with Dr Gerrit Friends) - she feels much better and feels she can breathe now - only mild SOB in last month  - allergies? - thyroid scar healing >> recommended Mederma. She has been using scar strips and cocoa butter  2. Postsurgical status - we discussed that she has a risk of 25% of developing hypothyroidism after lobectomy >> will check TFTs today - we discussed about how to take LT4 correctly if she ends up needing this: Take the thyroid hormone every day, with water, >30 minutes before breakfast, separated by >4 hours from acid reflux medications, calcium, iron, multivitamins. - I'll see her back as needed but will need labs in ~6 mo - If all labs are normal, she will continue to follow with PCP, with annual TSH levels  Office Visit on 12/21/2015  Component Date Value Ref Range Status  . T3, Free 12/21/2015 3.1  2.3 - 4.2 pg/mL Final  . Free T4 12/21/2015 0.87  0.60  - 1.60 ng/dL Final  . TSH 96/01/5408 0.69  0.35 - 4.50 uIU/mL Final   Msg sent: Dear Ms Coonrod, Labs are great! Let's plan to repeat them in 6 months, as we discussed. Sincerely, Carlus Pavlov MD

## 2015-12-28 ENCOUNTER — Ambulatory Visit (INDEPENDENT_AMBULATORY_CARE_PROVIDER_SITE_OTHER): Payer: BLUE CROSS/BLUE SHIELD | Admitting: Internal Medicine

## 2015-12-28 ENCOUNTER — Encounter: Payer: Self-pay | Admitting: Internal Medicine

## 2015-12-28 VITALS — BP 110/60 | HR 78 | Temp 98.2°F | Resp 18 | Ht 66.5 in | Wt 163.0 lb

## 2015-12-28 DIAGNOSIS — J069 Acute upper respiratory infection, unspecified: Secondary | ICD-10-CM | POA: Diagnosis not present

## 2015-12-28 MED ORDER — DEXAMETHASONE SODIUM PHOSPHATE 100 MG/10ML IJ SOLN
10.0000 mg | Freq: Once | INTRAMUSCULAR | Status: AC
  Administered 2015-12-28: 10 mg via INTRAMUSCULAR

## 2015-12-28 MED ORDER — IPRATROPIUM-ALBUTEROL 0.5-2.5 (3) MG/3ML IN SOLN
3.0000 mL | Freq: Once | RESPIRATORY_TRACT | Status: AC
  Administered 2015-12-28: 3 mL via RESPIRATORY_TRACT

## 2015-12-28 MED ORDER — ALBUTEROL SULFATE HFA 108 (90 BASE) MCG/ACT IN AERS: 2.0000 | INHALATION_SPRAY | RESPIRATORY_TRACT | Status: DC | PRN

## 2015-12-28 MED ORDER — FLUTICASONE PROPIONATE 50 MCG/ACT NA SUSP
2.0000 | Freq: Every day | NASAL | Status: DC
Start: 2015-12-28 — End: 2016-02-28

## 2015-12-28 MED ORDER — PROMETHAZINE-DM 6.25-15 MG/5ML PO SYRP: ORAL_SOLUTION | ORAL | Status: DC

## 2015-12-28 MED ORDER — FLUTICASONE FUROATE-VILANTEROL 100-25 MCG/INH IN AEPB: 1.0000 | INHALATION_SPRAY | Freq: Every day | RESPIRATORY_TRACT | Status: DC

## 2015-12-28 NOTE — Progress Notes (Signed)
HPI  Patient presents to the office for evaluation of cough and wheezing x 1 week.  It has been going on for 1 weeks.  Patient reports night > day, dry, barky, worse with lying down.  They also endorse change in voice, postnasal drip, shortness of breath, wheezing and scratchy throat and sore throat today, popping in the ears.  .  They have tried nyquil and halls cough drops.  They report that nothing has worked.  They admits to other sick contacts.  Review of Systems  Constitutional: Positive for malaise/fatigue. Negative for fever and chills.  HENT: Positive for congestion, ear pain and sore throat.   Respiratory: Positive for cough, shortness of breath and wheezing.   Cardiovascular: Negative for chest pain, palpitations and leg swelling.  Neurological: Negative for headaches.    PE:  Filed Vitals:   12/28/15 1432  BP: 110/60  Pulse: 78  Temp: 98.2 F (36.8 C)  Resp: 18   General:  Alert and non-toxic, WDWN, NAD HEENT: NCAT, PERLA, EOM normal, no occular discharge or erythema.  Nasal mucosal edema with sinus tenderness to palpation.  Oropharynx clear with minimal oropharyngeal edema and erythema.  Mucous membranes moist and pink. Neck:  Cervical adenopathy Chest:  RRR no MRGs.  Lungs clear to auscultation A&P with no wheezes rhonchi or rales.   Abdomen: +BS x 4 quadrants, soft, non-tender, no guarding, rigidity, or rebound. Skin: warm and dry no rash Neuro: A&Ox4, CN II-XII grossly intact  Assessment and Plan:   1. Acute URI  - promethazine-dextromethorphan (PROMETHAZINE-DM) 6.25-15 MG/5ML syrup; Take 5-10 mL PO q8hrs prn for cough symptoms  Dispense: 360 mL; Refill: 1 - dexamethasone (DECADRON) injection 10 mg; Inject 1 mL (10 mg total) into the muscle once. - fluticasone furoate-vilanterol (BREO ELLIPTA) 100-25 MCG/INH AEPB; Inhale 1 puff into the lungs daily.  Dispense: 1 each; Refill: 0 - albuterol (PROVENTIL HFA;VENTOLIN HFA) 108 (90 Base) MCG/ACT inhaler; Inhale 2 puffs  into the lungs every 2 (two) hours as needed for wheezing or shortness of breath (cough).  Dispense: 1 Inhaler; Refill: 3 - fluticasone (FLONASE) 50 MCG/ACT nasal spray; Place 2 sprays into both nostrils daily.  Dispense: 16 g; Refill: 0 - ipratropium-albuterol (DUONEB) 0.5-2.5 (3) MG/3ML nebulizer solution 3 mL; Take 3 mLs by nebulization once.

## 2016-02-26 ENCOUNTER — Encounter: Payer: Self-pay | Admitting: Physician Assistant

## 2016-02-28 ENCOUNTER — Encounter: Payer: Self-pay | Admitting: Physician Assistant

## 2016-02-28 ENCOUNTER — Ambulatory Visit (INDEPENDENT_AMBULATORY_CARE_PROVIDER_SITE_OTHER): Payer: BLUE CROSS/BLUE SHIELD | Admitting: Physician Assistant

## 2016-02-28 VITALS — BP 110/70 | HR 66 | Temp 97.5°F | Resp 16 | Ht 67.0 in | Wt 164.2 lb

## 2016-02-28 DIAGNOSIS — J4520 Mild intermittent asthma, uncomplicated: Secondary | ICD-10-CM

## 2016-02-28 DIAGNOSIS — Z79899 Other long term (current) drug therapy: Secondary | ICD-10-CM | POA: Diagnosis not present

## 2016-02-28 DIAGNOSIS — E559 Vitamin D deficiency, unspecified: Secondary | ICD-10-CM

## 2016-02-28 DIAGNOSIS — R6889 Other general symptoms and signs: Secondary | ICD-10-CM | POA: Diagnosis not present

## 2016-02-28 DIAGNOSIS — Z13220 Encounter for screening for lipoid disorders: Secondary | ICD-10-CM

## 2016-02-28 DIAGNOSIS — Z0001 Encounter for general adult medical examination with abnormal findings: Secondary | ICD-10-CM | POA: Diagnosis not present

## 2016-02-28 DIAGNOSIS — E88819 Insulin resistance, unspecified: Secondary | ICD-10-CM

## 2016-02-28 DIAGNOSIS — D649 Anemia, unspecified: Secondary | ICD-10-CM | POA: Diagnosis not present

## 2016-02-28 DIAGNOSIS — Z1389 Encounter for screening for other disorder: Secondary | ICD-10-CM

## 2016-02-28 DIAGNOSIS — Z0000 Encounter for general adult medical examination without abnormal findings: Secondary | ICD-10-CM

## 2016-02-28 DIAGNOSIS — E8881 Metabolic syndrome: Secondary | ICD-10-CM | POA: Diagnosis not present

## 2016-02-28 DIAGNOSIS — E890 Postprocedural hypothyroidism: Secondary | ICD-10-CM

## 2016-02-28 DIAGNOSIS — Z9889 Other specified postprocedural states: Secondary | ICD-10-CM

## 2016-02-28 LAB — CBC WITH DIFFERENTIAL/PLATELET
Basophils Absolute: 0 cells/uL (ref 0–200)
Basophils Relative: 0 %
Eosinophils Absolute: 240 cells/uL (ref 15–500)
Eosinophils Relative: 5 %
HEMATOCRIT: 38.8 % (ref 35.0–45.0)
Hemoglobin: 12.8 g/dL (ref 11.7–15.5)
Lymphocytes Relative: 38 %
Lymphs Abs: 1824 cells/uL (ref 850–3900)
MCH: 30.8 pg (ref 27.0–33.0)
MCHC: 33 g/dL (ref 32.0–36.0)
MCV: 93.5 fL (ref 80.0–100.0)
MONO ABS: 480 {cells}/uL (ref 200–950)
MPV: 10.4 fL (ref 7.5–12.5)
Monocytes Relative: 10 %
Neutro Abs: 2256 cells/uL (ref 1500–7800)
Neutrophils Relative %: 47 %
Platelets: 271 10*3/uL (ref 140–400)
RBC: 4.15 MIL/uL (ref 3.80–5.10)
RDW: 13.6 % (ref 11.0–15.0)
WBC: 4.8 10*3/uL (ref 3.8–10.8)

## 2016-02-28 LAB — HEMOGLOBIN A1C
Hgb A1c MFr Bld: 5.1 % (ref <5.7)
Mean Plasma Glucose: 100 mg/dL

## 2016-02-28 MED ORDER — MONTELUKAST SODIUM 10 MG PO TABS: 10.0000 mg | ORAL_TABLET | Freq: Every day | ORAL | Status: DC

## 2016-02-28 NOTE — Progress Notes (Signed)
Complete Physical  Assessment and Plan: S/p thyroid nodule removal- no sx's, breathing/compressive symptoms improved- will check TSH now and in 6 months Asthma/allergies- continue albuterol, can add on singualir for allergies.  Depression/anxiety in remission, continue exercise. .  Vitamin D- continue medication General health maintainence - follow up with GYN  Future Appointments Date Time Provider Department Center  03/10/2017 2:00 PM Quentin Mulling, PA-C [facility] None    HPI Patient presents for complete physical.   Vitamin D def on medications.  Had benign thyroid nodule removal in August due to compressive symptoms and states she is doing well.  Has seen Dr. Maple Hudson, had PFTs 09/2013, and thought to have mild asthma, on albuterol, has had some flares with allergies.  She has had a normal cholesterol,  Lab Results  Component Value Date   CHOL 137 02/22/2015   HDL 60 02/22/2015   LDLCALC 66 02/22/2015   TRIG 56 02/22/2015   CHOLHDL 2.3 02/22/2015  In 2014 her A1C was normal at 5.3 but she had an elevated insulin at 30.  Wt Readings from Last 3 Encounters:  02/28/16 164 lb 3.2 oz (74.481 kg)  12/28/15 163 lb (73.936 kg)  12/21/15 165 lb (74.844 kg)  She is married with 46 son, 37 years old years old  Current Medications:    Medication List       This list is accurate as of: 02/28/16  9:29 AM.  Always use your most recent med list.               albuterol 108 (90 Base) MCG/ACT inhaler  Commonly known as:  PROVENTIL HFA;VENTOLIN HFA  Inhale 2 puffs into the lungs every 2 (two) hours as needed for wheezing or shortness of breath (cough).     aspirin 81 MG chewable tablet  Chew 81 mg by mouth daily.     levonorgestrel 20 MCG/24HR IUD  Commonly known as:  MIRENA  1 each by Intrauterine route once.       Health Maintainance:  Immunization History  Administered Date(s) Administered  . Td 08/13/2005  . Tdap 02/22/2015   Tetatus 2016 Pap: 02/2014, had 1 abnormal pap years  ago but on repeat normal within last 10 years Sexually active: yes, STD testing offered but declines.  LMP: on mirena placed 2015 MGM N/A Colonoscopy N/A Eye Doctor Dr. Hyacinth Meeker Dec 2014, wears glasses, 2 years  Allergies:  No Known Allergies Medical History:  Past Medical History  Diagnosis Date  . Allergy   . Depression   . Anxiety   . Heart murmur     born with a heart murmur; pt stated "I havent seen a doctor for it since I was a kid"  . Pneumonia     hx of  . Bronchitis     hx of  . Thyroid nodule    Surgical History:  Past Surgical History  Procedure Laterality Date  . Thyroid lobectomy Left 05/11/2015  . Thyroidectomy N/A 05/11/2015    Procedure: LEFT THYROIDECTOMY LOBECTOMY;  Surgeon: Darnell Level, MD;  Location: [facility] OR;  Service: General;  Laterality: N/A;   Family History:  Family History  Problem Relation Age of Onset  . Heart disease Maternal Grandmother   . Brain cancer Maternal Grandfather   . Breast cancer Maternal Aunt    Social History:  Social History  Substance Use Topics  . Smoking status: Former Smoker -- 0.50 packs/day for 11 years    Types: Cigarettes    Quit date: 07/27/2007  . Smokeless  tobacco: Never Used  . Alcohol Use: Yes     Comment: occasionally   Review of Systems  Constitutional: Negative.  Negative for fever and chills.  HENT: Negative for congestion, ear discharge, ear pain, hearing loss, nosebleeds, sore throat and tinnitus.   Respiratory: Negative for cough, hemoptysis, sputum production, shortness of breath, wheezing and stridor.   Cardiovascular: Negative.   Gastrointestinal: Negative.   Genitourinary: Negative.   Musculoskeletal: Negative.   Skin: Negative.  Negative for rash.  Neurological: Negative.  Negative for headaches.  Psychiatric/Behavioral: Negative.     Physical Exam: Estimated body mass index is 25.71 kg/(m^2) as calculated from the following:   Height as of this encounter: 5\' 7"  (1.702 m).   Weight as of  this encounter: 164 lb 3.2 oz (74.481 kg). Filed Vitals:   02/28/16 0913  BP: 110/70  Pulse: 66  Temp: 97.5 F (36.4 C)  Resp: 16   General Appearance: Well nourished, in no apparent distress. Eyes: PERRLA, EOMs, conjunctiva no swelling or erythema, normal fundi and vessels. Sinuses: No Frontal/maxillary tenderness ENT/Mouth: Ext aud canals clear, normal light reflex with TMs without erythema, bulging.  Good dentition. No erythema, swelling, or exudate on post pharynx. Tonsils not swollen or erythematous. Hearing normal.  Neck: Supple, well healing horizontal thyroid scar. No bruits Respiratory: Respiratory effort normal, BS equal bilaterally without rales, rhonci, wheezing or stridor. Cardio: Heart sounds normal, regular rate and rhythm without murmurs, rubs or gallops. Peripheral pulses brisk and equal bilaterally, without edema.  Chest: symmetric, with normal excursions and percussion. Breasts: defer OB/GYN Abdomen: Flat, soft, with bowl sounds, no tenderness, no guarding, rebound, hernias, masses, or organomegaly. .  Lymphatics: Non tender without lymphadenopathy Genitourinary: defer Musculoskeletal: Full ROM all peripheral extremities,5/5 strength, and normal gait. Skin: Warm, dry without rashes, lesions, ecchymosis.  Neuro: Cranial nerves intact, reflexes equal bilaterally. Normal muscle tone, no cerebellar symptoms. Sensation intact.  Pysch: Awake and oriented X 3, normal affect, Insight and Judgment appropriate.   EKG:  Normal   Quentin MullingAmanda Semaya Vida 9:18 AM

## 2016-02-29 LAB — HEPATIC FUNCTION PANEL
ALT: 11 U/L (ref 6–29)
AST: 15 U/L (ref 10–30)
Albumin: 4.3 g/dL (ref 3.6–5.1)
Alkaline Phosphatase: 44 U/L (ref 33–115)
BILIRUBIN INDIRECT: 0.4 mg/dL (ref 0.2–1.2)
Bilirubin, Direct: 0.1 mg/dL (ref <=0.2)
TOTAL PROTEIN: 6.9 g/dL (ref 6.1–8.1)
Total Bilirubin: 0.5 mg/dL (ref 0.2–1.2)

## 2016-02-29 LAB — IRON AND TIBC
%SAT: 38 % (ref 11–50)
Iron: 102 ug/dL (ref 40–190)
TIBC: 272 ug/dL (ref 250–450)
UIBC: 170 ug/dL (ref 125–400)

## 2016-02-29 LAB — BASIC METABOLIC PANEL WITH GFR
BUN: 12 mg/dL (ref 7–25)
CO2: 23 mmol/L (ref 20–31)
Calcium: 9.1 mg/dL (ref 8.6–10.2)
Chloride: 103 mmol/L (ref 98–110)
Creat: 0.71 mg/dL (ref 0.50–1.10)
GFR, Est African American: >89 mL/min (ref >=60)
GLUCOSE: 70 mg/dL (ref 65–99)
POTASSIUM: 4 mmol/L (ref 3.5–5.3)
Sodium: 137 mmol/L (ref 135–146)

## 2016-02-29 LAB — URINALYSIS, ROUTINE W REFLEX MICROSCOPIC
Bilirubin Urine: NEGATIVE
Glucose, UA: NEGATIVE
Hgb urine dipstick: NEGATIVE
KETONES UR: NEGATIVE
Leukocytes, UA: NEGATIVE
Nitrite: NEGATIVE
PH: 7 (ref 5.0–8.0)
Protein, ur: NEGATIVE
Specific Gravity, Urine: 1.013 (ref 1.001–1.035)

## 2016-02-29 LAB — LIPID PANEL
Cholesterol: 160 mg/dL (ref 125–200)
HDL: 62 mg/dL (ref >=46)
LDL Cholesterol: 88 mg/dL (ref <130)
Total CHOL/HDL Ratio: 2.6 Ratio (ref <=5.0)
Triglycerides: 48 mg/dL (ref <150)
VLDL: 10 mg/dL (ref <30)

## 2016-02-29 LAB — FERRITIN: FERRITIN: 159 ng/mL — AB (ref 10–154)

## 2016-02-29 LAB — VITAMIN B12: Vitamin B-12: 483 pg/mL (ref 200–1100)

## 2016-02-29 LAB — MICROALBUMIN / CREATININE URINE RATIO
CREATININE, URINE: 69 mg/dL (ref 20–320)
MICROALB/CREAT RATIO: 6 ug/mg{creat} (ref <30)
Microalb, Ur: 0.4 mg/dL

## 2016-02-29 LAB — INSULIN, FASTING: INSULIN FASTING, SERUM: 4.7 u[IU]/mL (ref 2.0–19.6)

## 2016-02-29 LAB — TSH: TSH: 1.3 mIU/L

## 2016-02-29 LAB — VITAMIN D 25 HYDROXY (VIT D DEFICIENCY, FRACTURES): Vit D, 25-Hydroxy: 33 ng/mL (ref 30–100)

## 2016-02-29 LAB — MAGNESIUM: Magnesium: 2 mg/dL (ref 1.5–2.5)

## 2016-03-24 ENCOUNTER — Encounter: Payer: Self-pay | Admitting: Physician Assistant

## 2016-03-24 MED ORDER — CYCLOBENZAPRINE HCL 5 MG PO TABS: 10.0000 mg | ORAL_TABLET | Freq: Three times a day (TID) | ORAL | Status: DC | PRN

## 2016-03-24 NOTE — Addendum Note (Signed)
Addended by: Quentin MullingOLLIER, AMANDA R on: 03/24/2016 04:09 PM   Modules accepted: Orders

## 2016-05-03 ENCOUNTER — Encounter: Payer: Self-pay | Admitting: Physician Assistant

## 2016-05-03 ENCOUNTER — Ambulatory Visit (INDEPENDENT_AMBULATORY_CARE_PROVIDER_SITE_OTHER): Payer: BLUE CROSS/BLUE SHIELD | Admitting: Physician Assistant

## 2016-05-03 VITALS — BP 118/70 | HR 71 | Temp 98.2°F | Resp 16 | Ht 67.0 in | Wt 165.6 lb

## 2016-05-03 DIAGNOSIS — H6692 Otitis media, unspecified, left ear: Secondary | ICD-10-CM | POA: Diagnosis not present

## 2016-05-03 MED ORDER — NEOMYCIN-POLYMYXIN-HC 3.5-10000-1 OT SOLN: 4.0000 [drp] | Freq: Four times a day (QID) | OTIC | 0 refills | Status: DC

## 2016-05-03 MED ORDER — AZITHROMYCIN 250 MG PO TABS: ORAL_TABLET | ORAL | 1 refills | Status: AC

## 2016-05-03 MED ORDER — PREDNISONE 20 MG PO TABS: ORAL_TABLET | ORAL | 0 refills | Status: DC

## 2016-05-03 NOTE — Progress Notes (Signed)
Subjective:    Patient ID: Jennifer Ponce, female    DOB: 11-May-1981, 35 y.o.   MRN: [id number]  HPI 35 y.o. age WF presents with ear pain x 2 weeks. Had pimple in her left ear 2 weeks ago, then Wednesday day got progressively worse, has been taking advil and nasal spray which helped some but last night she did not sleep at all due to pain. She now has jaw pain, worse with biting down, feels swollen. No fever, or chills. Has some sore throat and hoarseness today.   Blood pressure 118/70, pulse 71, temperature 98.2 F (36.8 C), temperature source Temporal, resp. rate 16, height  (1.702 m), weight 165 lb 9.6 oz (75.1 kg), last menstrual period 04/28/2016, SpO2 99 %.  Medications Current Outpatient Prescriptions on File Prior to Visit  Medication Sig  . albuterol (PROVENTIL HFA;VENTOLIN HFA) 108 (90 Base) MCG/ACT inhaler Inhale 2 puffs into the lungs every 2 (two) hours as needed for wheezing or shortness of breath (cough).  Marland Kitchen aspirin 81 MG chewable tablet Chew 81 mg by mouth daily.  . cyclobenzaprine (FLEXERIL) 5 MG tablet Take 2 tablets (10 mg total) by mouth every 8 (eight) hours as needed for muscle spasms.  Marland Kitchen levonorgestrel (MIRENA) 20 MCG/24HR IUD 1 each by Intrauterine route once.  . montelukast (SINGULAIR) 10 MG tablet Take 1 tablet (10 mg total) by mouth daily.   No current facility-administered medications on file prior to visit.     Problem list She has Dyspnea; Clinical depression; Insulin resistance; History of thyroid nodule; and S/P partial thyroidectomy on her problem list.  Review of Systems  Constitutional: Negative.  Negative for chills, fatigue and fever.  HENT: Positive for ear pain, sore throat and voice change. Negative for congestion, dental problem, drooling, ear discharge, facial swelling, hearing loss, mouth sores, nosebleeds, postnasal drip, rhinorrhea, sinus pressure, sneezing, tinnitus and trouble swallowing.   Eyes: Negative.  Negative for visual  disturbance.  Respiratory: Negative for apnea, cough, choking, chest tightness, shortness of breath, wheezing and stridor.   Cardiovascular: Negative.   Genitourinary: Negative.   Neurological: Positive for headaches. Negative for dizziness, tremors, seizures, syncope, facial asymmetry, speech difficulty, weakness, light-headedness and numbness.       Objective:   Physical Exam  Constitutional: She is oriented to person, place, and time. She appears well-developed and well-nourished.  HENT:  Right Ear: Hearing normal. No tenderness. No mastoid tenderness. Tympanic membrane is not injected, not perforated, not erythematous, not retracted and not bulging. No middle ear effusion.  Left Ear: Hearing normal. There is tenderness. No mastoid tenderness. Tympanic membrane is injected, erythematous and bulging. Tympanic membrane is not retracted. A middle ear effusion is present.  Nose: Nose normal. Right sinus exhibits no maxillary sinus tenderness and no frontal sinus tenderness. Left sinus exhibits no maxillary sinus tenderness and no frontal sinus tenderness.  Mouth/Throat: Uvula is midline and mucous membranes are normal. No trismus in the jaw. Posterior oropharyngeal erythema present. No oropharyngeal exudate, posterior oropharyngeal edema or tonsillar abscesses.  Eyes: Conjunctivae and EOM are normal. Pupils are equal, round, and reactive to light.  Neck: Neck supple.  Cardiovascular: Normal rate and regular rhythm.   Pulmonary/Chest: Effort normal and breath sounds normal. No respiratory distress. She has no wheezes.  Abdominal: Soft. Bowel sounds are normal.  Musculoskeletal: Normal range of motion.  Lymphadenopathy:    She has no cervical adenopathy.  Neurological: She is alert and oriented to person, place, and time.  Skin: Skin  is warm and dry.       Assessment & Plan:  1. Acute left otitis media, recurrence not specified, unspecified otitis media type - azithromycin (ZITHROMAX) 250  MG tablet; Take 2 tablets (500 mg) on  Day 1,  followed by 1 tablet (250 mg) once daily on Days 2 through 5.  Dispense: 6 each; Refill: 1 - predniSONE (DELTASONE) 20 MG tablet; 2 tablets daily for 3 days, 1 tablet daily for 4 days.  Dispense: 10 tablet; Refill: 0 - neomycin-polymyxin-hydrocortisone (CORTISPORIN) otic solution; Place 4 drops into the left ear 4 (four) times daily. 7 days- Please dispense quantity sufficient.  Dispense: 10 mL; Refill: 0

## 2016-05-03 NOTE — Patient Instructions (Signed)
Please take the prednisone to help decrease inflammation and therefore decrease symptoms. Take it it with food to avoid GI upset. It can cause increased energy but on the other hand it can make it hard to sleep at night so please take it AT NIGHT WITH DINNER, it takes 8-12 hours to start working so it will NOT affect your sleeping if you take it at night with your food!!  If you are diabetic it will increase your sugars so decrease carbs and monitor your sugars closely.     Otitis Media, Adult Otitis media is redness, soreness, and inflammation of the middle ear. Otitis media may be caused by allergies or, most commonly, by infection. Often it occurs as a complication of the common cold. SIGNS AND SYMPTOMS Symptoms of otitis media may include:  Earache.  Fever.  Ringing in your ear.  Headache.  Leakage of fluid from the ear. DIAGNOSIS To diagnose otitis media, your health care provider will examine your ear with an otoscope. This is an instrument that allows your health care provider to see into your ear in order to examine your eardrum. Your health care provider also will ask you questions about your symptoms. TREATMENT  Typically, otitis media resolves on its own within 3-5 days. Your health care provider may prescribe medicine to ease your symptoms of pain. If otitis media does not resolve within 5 days or is recurrent, your health care provider may prescribe antibiotic medicines if he or she suspects that a bacterial infection is the cause. HOME CARE INSTRUCTIONS   If you were prescribed an antibiotic medicine, finish it all even if you start to feel better.  Take medicines only as directed by your health care provider.  Keep all follow-up visits as directed by your health care provider. SEEK MEDICAL CARE IF:  You have otitis media only in one ear, or bleeding from your nose, or both.  You notice a lump on your neck.  You are not getting better in 3-5 days.  You feel worse  instead of better. SEEK IMMEDIATE MEDICAL CARE IF:   You have pain that is not controlled with medicine.  You have swelling, redness, or pain around your ear or stiffness in your neck.  You notice that part of your face is paralyzed.  You notice that the bone behind your ear (mastoid) is tender when you touch it. MAKE SURE YOU:   Understand these instructions.  Will watch your condition.  Will get help right away if you are not doing well or get worse.   This information is not intended to replace advice given to you by your health care provider. Make sure you discuss any questions you have with your health care provider.   Document Released: 06/28/2004 Document Revised: 10/14/2014 Document Reviewed: 04/20/2013 Elsevier Interactive Patient Education [employer]

## 2016-07-13 ENCOUNTER — Encounter: Payer: Self-pay | Admitting: Physician Assistant

## 2016-07-14 MED ORDER — PREDNISONE 20 MG PO TABS: ORAL_TABLET | ORAL | 0 refills | Status: DC

## 2016-07-14 MED ORDER — AZITHROMYCIN 250 MG PO TABS: ORAL_TABLET | ORAL | 1 refills | Status: AC

## 2016-08-19 ENCOUNTER — Other Ambulatory Visit: Payer: BLUE CROSS/BLUE SHIELD

## 2016-08-19 DIAGNOSIS — E890 Postprocedural hypothyroidism: Secondary | ICD-10-CM

## 2016-08-19 DIAGNOSIS — Z9889 Other specified postprocedural states: Secondary | ICD-10-CM

## 2016-08-19 LAB — TSH: TSH: 1.3 m[IU]/L

## 2016-10-24 ENCOUNTER — Encounter: Payer: Self-pay | Admitting: Physician Assistant

## 2016-10-24 ENCOUNTER — Other Ambulatory Visit: Payer: Self-pay | Admitting: Physician Assistant

## 2016-10-24 MED ORDER — PROMETHAZINE-DM 6.25-15 MG/5ML PO SYRP: 5.0000 mL | ORAL_SOLUTION | Freq: Four times a day (QID) | ORAL | 1 refills | Status: DC | PRN

## 2016-10-24 MED ORDER — PREDNISONE 20 MG PO TABS: ORAL_TABLET | ORAL | 0 refills | Status: DC

## 2016-10-31 ENCOUNTER — Ambulatory Visit (INDEPENDENT_AMBULATORY_CARE_PROVIDER_SITE_OTHER): Payer: BLUE CROSS/BLUE SHIELD | Admitting: Physician Assistant

## 2016-10-31 ENCOUNTER — Encounter: Payer: Self-pay | Admitting: Physician Assistant

## 2016-10-31 VITALS — BP 120/60 | HR 88 | Temp 97.7°F | Resp 16 | Ht 67.0 in | Wt 168.1 lb

## 2016-10-31 DIAGNOSIS — R05 Cough: Secondary | ICD-10-CM

## 2016-10-31 DIAGNOSIS — J45909 Unspecified asthma, uncomplicated: Secondary | ICD-10-CM | POA: Diagnosis not present

## 2016-10-31 DIAGNOSIS — R059 Cough, unspecified: Secondary | ICD-10-CM

## 2016-10-31 MED ORDER — FLUTICASONE FUROATE-VILANTEROL 100-25 MCG/INH IN AEPB: 1.0000 | INHALATION_SPRAY | Freq: Every day | RESPIRATORY_TRACT | 1 refills | Status: DC

## 2016-10-31 MED ORDER — PREDNISONE 20 MG PO TABS: ORAL_TABLET | ORAL | 0 refills | Status: DC

## 2016-10-31 MED ORDER — DEXAMETHASONE SODIUM PHOSPHATE 100 MG/10ML IJ SOLN
10.0000 mg | Freq: Once | INTRAMUSCULAR | Status: AC
  Administered 2016-10-31: 10 mg via INTRAMUSCULAR

## 2016-10-31 MED ORDER — AZITHROMYCIN 250 MG PO TABS: ORAL_TABLET | ORAL | 1 refills | Status: AC

## 2016-10-31 MED ORDER — IPRATROPIUM-ALBUTEROL 0.5-2.5 (3) MG/3ML IN SOLN
3.0000 mL | Freq: Once | RESPIRATORY_TRACT | Status: AC
  Administered 2016-10-31: 3 mL via RESPIRATORY_TRACT

## 2016-10-31 NOTE — Progress Notes (Signed)
Subjective:    Patient ID: Jennifer Ponce, female    DOB: 1981/08/06, 36 y.o.   MRN: [id number]  HPI 36 y.o. WF presents for follow up. Had possible flu, son tested positive, she was treated with prednisone, allergy, and flonase, she is feeling better however she continues to have cough and wheezing. No fever, chills. She has been doing albuterol that helps some.   Blood pressure 120/60, pulse 88, temperature 97.7 F (36.5 C), resp. rate 16, height 5\' 7"  (1.702 m), weight 168 lb 1.6 oz (76.2 kg), last menstrual period 10/24/2016, SpO2 97 %.  Medications Current Outpatient Prescriptions on File Prior to Visit  Medication Sig  . albuterol (PROVENTIL HFA;VENTOLIN HFA) 108 (90 Base) MCG/ACT inhaler Inhale 2 puffs into the lungs every 2 (two) hours as needed for wheezing or shortness of breath (cough).  Marland Kitchen aspirin 81 MG chewable tablet Chew 81 mg by mouth daily.  . cyclobenzaprine (FLEXERIL) 5 MG tablet Take 2 tablets (10 mg total) by mouth every 8 (eight) hours as needed for muscle spasms.  Marland Kitchen levonorgestrel (MIRENA) 20 MCG/24HR IUD 1 each by Intrauterine route once.  . montelukast (SINGULAIR) 10 MG tablet Take 1 tablet (10 mg total) by mouth daily.  . predniSONE (DELTASONE) 20 MG tablet 2 tablets daily for 3 days, 1 tablet daily for 4 days.   No current facility-administered medications on file prior to visit.     Problem list She has Dyspnea; Clinical depression; Insulin resistance; History of thyroid nodule; and S/P partial thyroidectomy on her problem list.   Review of Systems  Constitutional: Positive for fatigue. Negative for chills and fever.  HENT: Positive for congestion, rhinorrhea, sinus pressure and sore throat. Negative for dental problem, ear discharge, ear pain, nosebleeds, trouble swallowing and voice change.   Respiratory: Positive for cough, chest tightness, shortness of breath and wheezing.   Cardiovascular: Negative.   Gastrointestinal: Negative.   Genitourinary:  Negative.   Musculoskeletal: Negative.   Neurological: Negative.        Objective:   Physical Exam  Constitutional: She is oriented to person, place, and time. She appears well-developed and well-nourished.  HENT:  Head: Normocephalic and atraumatic.  Right Ear: External ear normal.  Left Ear: External ear normal.  Nose: Nose normal.  Mouth/Throat: Oropharynx is clear and moist.  Eyes: Conjunctivae are normal. Pupils are equal, round, and reactive to light.  Neck: Normal range of motion. Neck supple.  Cardiovascular: Normal rate and regular rhythm.   Pulmonary/Chest: Effort normal. No respiratory distress. She has wheezes (worse left lower lobe). She has no rales. She exhibits no tenderness.  Abdominal: Soft. Bowel sounds are normal.  Lymphadenopathy:    She has no cervical adenopathy.  Neurological: She is alert and oriented to person, place, and time.  Skin: Skin is warm and dry.       Assessment & Plan:  1. Asthma, chronic, unspecified asthma severity, uncomplicated with Cough Recently with flu, wheezing isolated left lung, will treat like pneumonia, douneb in office, zpak, pred, breo inhaler.  - predniSONE (DELTASONE) 20 MG tablet; 2 tablets daily for 3 days, 1 tablet daily until finished  Dispense: 30 tablet; Refill: 0 - azithromycin (ZITHROMAX) 250 MG tablet; Take 2 tablets (500 mg) on  Day 1,  followed by 1 tablet (250 mg) once daily on Days 2 through 5.  Dispense: 6 each; Refill: 1 - ipratropium-albuterol (DUONEB) 0.5-2.5 (3) MG/3ML nebulizer solution 3 mL; Take 3 mLs by nebulization once. - dexamethasone (DECADRON) injection  10 mg; Inject 1 mL (10 mg total) into the muscle once.

## 2016-10-31 NOTE — Patient Instructions (Signed)
Community-Acquired Pneumonia, Adult °Pneumonia is an infection of the lungs. There are different types of pneumonia. One type can develop while a person is in a hospital. A different type, called community-acquired pneumonia, develops in people who are not, or have not recently been, in the hospital or other health care facility. °What are the causes? °Pneumonia may be caused by bacteria, viruses, or funguses. Community-acquired pneumonia is often caused by Streptococcus pneumonia bacteria. These bacteria are often passed from one person to another by breathing in droplets from the cough or sneeze of an infected person. °What increases the risk? °The condition is more likely to develop in: °· People who have chronic diseases, such as chronic obstructive pulmonary disease (COPD), asthma, congestive heart failure, cystic fibrosis, diabetes, or kidney disease. °· People who have early-stage or late-stage HIV. °· People who have sickle cell disease. °· People who have had their spleen removed (splenectomy). °· People who have poor dental hygiene. °· People who have medical conditions that increase the risk of breathing in (aspirating) secretions their own mouth and nose. °· People who have a weakened immune system (immunocompromised). °· People who smoke. °· People who travel to areas where pneumonia-causing germs commonly exist. °· People who are around animal habitats or animals that have pneumonia-causing germs, including birds, bats, rabbits, cats, and farm animals. ° °What are the signs or symptoms? °Symptoms of this condition include: °· A dry cough. °· A wet (productive) cough. °· Fever. °· Sweating. °· Chest pain, especially when breathing deeply or coughing. °· Rapid breathing or difficulty breathing. °· Shortness of breath. °· Shaking chills. °· Fatigue. °· Muscle aches. ° °How is this diagnosed? °Your health care provider will take a medical history and perform a physical exam. You may also have other tests,  including: °· Imaging studies of your chest, including X-rays. °· Tests to check your blood oxygen level and other blood gases. °· Other tests on blood, mucus (sputum), fluid around your lungs (pleural fluid), and urine. ° °If your pneumonia is severe, other tests may be done to identify the specific cause of your illness. °How is this treated? °The type of treatment that you receive depends on many factors, such as the cause of your pneumonia, the medicines you take, and other medical conditions that you have. For most adults, treatment and recovery from pneumonia may occur at home. In some cases, treatment must happen in a hospital. Treatment may include: °· Antibiotic medicines, if the pneumonia was caused by bacteria. °· Antiviral medicines, if the pneumonia was caused by a virus. °· Medicines that are given by mouth or through an IV tube. °· Oxygen. °· Respiratory therapy. ° °Although rare, treating severe pneumonia may include: °· Mechanical ventilation. This is done if you are not breathing well on your own and you cannot maintain a safe blood oxygen level. °· Thoracentesis. This procedure removes fluid around one lung or both lungs to help you breathe better. ° °Follow these instructions at home: °· Take over-the-counter and prescription medicines only as told by your health care provider. °? Only take cough medicine if you are losing sleep. Understand that cough medicine can prevent your body’s natural ability to remove mucus from your lungs. °? If you were prescribed an antibiotic medicine, take it as told by your health care provider. Do not stop taking the antibiotic even if you start to feel better. °· Sleep in a semi-upright position at night. Try sleeping in a reclining chair, or place a few pillows under your head. °· Do not use tobacco products, including cigarettes, chewing   tobacco, and e-cigarettes. If you need help quitting, ask your health care provider. °· Drink enough water to keep your urine  clear or pale yellow. This will help to thin out mucus secretions in your lungs. °How is this prevented? °There are ways that you can decrease your risk of developing community-acquired pneumonia. Consider getting a pneumococcal vaccine if: °· You are older than 38 years of age. °· You are older than 38 years of age and are undergoing cancer treatment, have chronic lung disease, or have other medical conditions that affect your immune system. Ask your health care provider if this applies to you. ° °There are different types and schedules of pneumococcal vaccines. Ask your health care provider which vaccination option is best for you. °You may also prevent community-acquired pneumonia if you take these actions: °· Get an influenza vaccine every year. Ask your health care provider which type of influenza vaccine is best for you. °· Go to the dentist on a regular basis. °· Wash your hands often. Use hand sanitizer if soap and water are not available. ° °Contact a health care provider if: °· You have a fever. °· You are losing sleep because you cannot control your cough with cough medicine. °Get help right away if: °· You have worsening shortness of breath. °· You have increased chest pain. °· Your sickness becomes worse, especially if you are an older adult or have a weakened immune system. °· You cough up blood. °This information is not intended to replace advice given to you by your health care provider. Make sure you discuss any questions you have with your health care provider. °Document Released: 09/23/2005 Document Revised: 02/01/2016 Document Reviewed: 01/18/2015 °Elsevier Interactive Patient Education © 2017 [employer] ° °

## 2016-11-03 ENCOUNTER — Emergency Department
Admission: EM | Admit: 2016-11-03 | Discharge: 2016-11-03 | Disposition: A | Discharge status: Home / Self Care | Payer: BLUE CROSS/BLUE SHIELD | Attending: Emergency Medicine | Admitting: Emergency Medicine

## 2016-11-03 ENCOUNTER — Encounter: Payer: Self-pay | Admitting: Emergency Medicine

## 2016-11-03 DIAGNOSIS — Z79899 Other long term (current) drug therapy: Secondary | ICD-10-CM | POA: Insufficient documentation

## 2016-11-03 DIAGNOSIS — Z87891 Personal history of nicotine dependence: Secondary | ICD-10-CM | POA: Diagnosis not present

## 2016-11-03 DIAGNOSIS — S61302A Unspecified open wound of right middle finger with damage to nail, initial encounter: Secondary | ICD-10-CM | POA: Insufficient documentation

## 2016-11-03 DIAGNOSIS — W268XXA Contact with other sharp object(s), not elsewhere classified, initial encounter: Secondary | ICD-10-CM | POA: Diagnosis not present

## 2016-11-03 DIAGNOSIS — Y9389 Activity, other specified: Secondary | ICD-10-CM | POA: Insufficient documentation

## 2016-11-03 DIAGNOSIS — T148XXA Other injury of unspecified body region, initial encounter: Secondary | ICD-10-CM

## 2016-11-03 DIAGNOSIS — Y929 Unspecified place or not applicable: Secondary | ICD-10-CM | POA: Diagnosis not present

## 2016-11-03 DIAGNOSIS — Z7982 Long term (current) use of aspirin: Secondary | ICD-10-CM | POA: Diagnosis not present

## 2016-11-03 DIAGNOSIS — S6991XA Unspecified injury of right wrist, hand and finger(s), initial encounter: Secondary | ICD-10-CM | POA: Diagnosis present

## 2016-11-03 DIAGNOSIS — S61202A Unspecified open wound of right middle finger without damage to nail, initial encounter: Secondary | ICD-10-CM | POA: Diagnosis not present

## 2016-11-03 DIAGNOSIS — Y999 Unspecified external cause status: Secondary | ICD-10-CM | POA: Diagnosis not present

## 2016-11-03 MED ORDER — NEOMYCIN-POLYMYXIN-PRAMOXINE 1 % EX CREA: TOPICAL_CREAM | Freq: Two times a day (BID) | CUTANEOUS | 0 refills | Status: DC

## 2016-11-03 MED ORDER — OXYCODONE-ACETAMINOPHEN 5-325 MG PO TABS
1.0000 | ORAL_TABLET | Freq: Once | ORAL | Status: AC
  Administered 2016-11-03: 1 via ORAL
  Filled 2016-11-03: qty 1

## 2016-11-03 MED ORDER — OXYCODONE-ACETAMINOPHEN 7.5-325 MG PO TABS: 1.0000 | ORAL_TABLET | Freq: Four times a day (QID) | ORAL | 0 refills | Status: DC | PRN

## 2016-11-03 NOTE — ED Notes (Signed)
AAOx3.  Skin warm and dry. nAD 

## 2016-11-03 NOTE — ED Provider Notes (Signed)
[facility] Emergency Department Provider Note   ____________________________________________   First MD Initiated Contact with Patient 11/03/16 1719     (approximate)  I have reviewed the triage vital signs and the nursing notes.   HISTORY  Chief Complaint Laceration    HPI Jennifer Ponce is a 36 y.o. female patient present skin avulsion from the distal right finger. Injury occurred when patient was cutting broccoli. Patient states tetanus shot up-to-date. Bleeding is controlled with direct pressure. Bleeding occurs. Pressures removed. Patient denies loss sensation or loss of function of the finger. States rates the pain as a 4/10. Patient described a pain as "achy". No other palliative measures for this complaint.  Past Medical History:  Diagnosis Date  . Allergy   . Anxiety   . Bronchitis    hx of  . Depression   . Heart murmur    born with a heart murmur; pt stated "I havent seen a doctor for it since I was a kid"  . Pneumonia    hx of  . Thyroid nodule     Patient Active Problem List   Diagnosis Date Noted  . 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 History of thyroid nodule 06/23/2015  . Insulin resistance 02/22/2015  . Clinical depression 10/19/2014  . Dyspnea 07/26/2013    Past Surgical History:  Procedure Laterality Date  . THYROID LOBECTOMY Left 05/11/2015  . THYROIDECTOMY N/A 05/11/2015   Procedure: LEFT THYROIDECTOMY LOBECTOMY;  Surgeon: Darnell Level, MD;  Location: [facility] OR;  Service: General;  Laterality: N/A;    Prior to Admission medications   Medication Sig Start Date End Date Taking? Authorizing Provider  albuterol (PROVENTIL HFA;VENTOLIN HFA) 108 (90 Base) MCG/ACT inhaler Inhale 2 puffs into the lungs every 2 (two) hours as needed for wheezing or shortness of breath (cough). 12/28/15   Courtney Forcucci, PA-C  aspirin 81 MG chewable tablet Chew 81 mg by mouth daily.    Historical Provider, MD  azithromycin (ZITHROMAX) 250  MG tablet Take 2 tablets (500 mg) on  Day 1,  followed by 1 tablet (250 mg) once daily on Days 2 through 5. 10/31/16 11/05/16  Quentin Mulling, PA-C  cyclobenzaprine (FLEXERIL) 5 MG tablet Take 2 tablets (10 mg total) by mouth every 8 (eight) hours as needed for muscle spasms. 03/24/16   Quentin Mulling, PA-C  fluticasone furoate-vilanterol (BREO ELLIPTA) 100-25 MCG/INH AEPB Inhale 1 puff into the lungs daily. Rinse mouth with water after each use 10/31/16   Quentin Mulling, PA-C  levonorgestrel (MIRENA) 20 MCG/24HR IUD 1 each by Intrauterine route once.    Historical Provider, MD  montelukast (SINGULAIR) 10 MG tablet Take 1 tablet (10 mg total) by mouth daily. 02/28/16 02/27/17  Quentin Mulling, PA-C  neomycin-polymyxin-pramoxine (NEOSPORIN PLUS) 1 % cream Apply topically 2 (two) times daily. 11/03/16   Joni Reining, PA-C  oxyCODONE-acetaminophen (PERCOCET) 7.5-325 MG tablet Take 1 tablet by mouth every 6 (six) hours as needed for severe pain. 11/03/16   Joni Reining, PA-C  predniSONE (DELTASONE) 20 MG tablet 2 tablets daily for 3 days, 1 tablet daily for 4 days. 10/24/16   Quentin Mulling, PA-C  predniSONE (DELTASONE) 20 MG tablet 2 tablets daily for 3 days, 1 tablet daily until finished 10/31/16   Quentin Mulling, PA-C    Allergies Patient has no known allergies.  Family History  Problem Relation Age of Onset  . Heart disease Maternal Grandmother   . Brain cancer Maternal Grandfather   . Breast cancer  Maternal Aunt     Social History Social History  Substance Use Topics  . Smoking status: Former Smoker    Packs/day: 0.50    Years: 11.00    Types: Cigarettes    Quit date: 07/27/2007  . Smokeless tobacco: Never Used  . Alcohol use Yes     Comment: occasionally    Review of Systems Constitutional: No fever/chills Eyes: No visual changes. ENT: No sore throat. Cardiovascular: Denies chest pain. Respiratory: Denies shortness of breath. Gastrointestinal: No abdominal pain.  No nausea, no  vomiting.  No diarrhea.  No constipation. Genitourinary: Negative for dysuria. Musculoskeletal: Negative for back pain. Skin: Negative for rash. Skin avulsion to the tip of the third digit right hand. Neurological: Negative for headaches, focal weakness or numbness. Psychiatric:Depression Endocrine:Insulin resistance  ____________________________________________   PHYSICAL EXAM:  VITAL SIGNS: ED Triage Vitals  Enc Vitals Group     BP 11/03/16 1623 134/82     Pulse Rate 11/03/16 1623 76     Resp 11/03/16 1623 16     Temp 11/03/16 1623 98.2 F (36.8 C)     Temp Source 11/03/16 1623 Oral     SpO2 11/03/16 1623 98 %     Weight 11/03/16 1624 160 lb (72.6 kg)     Height 11/03/16 1624 5\' 7"  (1.702 m)     Head Circumference --      Peak Flow --      Pain Score 11/03/16 1632 4     Pain Loc --      Pain Edu? --      Excl. in GC? --     Constitutional: Alert and oriented. Well appearing and in no acute distress. Eyes: Conjunctivae are normal. PERRL. EOMI. Head: Atraumatic. Nose: No congestion/rhinnorhea. Mouth/Throat: Mucous membranes are moist.  Oropharynx non-erythematous. Neck: No stridor.  No cervical spine tenderness to palpation. Hematological/Lymphatic/Immunilogical: No cervical lymphadenopathy. Cardiovascular: Normal rate, regular rhythm. Grossly normal heart sounds.  Good peripheral circulation. Respiratory: Normal respiratory effort.  No retractions. Lungs CTAB. Gastrointestinal: Soft and nontender. No distention. No abdominal bruits. No CVA tenderness. Musculoskeletal: No lower extremity tenderness nor edema.  No joint effusions. Neurologic:  Normal speech and language. No gross focal neurologic deficits are appreciated. No gait instability. Skin:  Skin is warm, dry and intact. No rash noted. Skin avulsion distal third right finger. Psychiatric: Mood and affect are normal. Speech and behavior are normal.  ____________________________________________   LABS (all  labs ordered are listed, but only abnormal results are displayed)  Labs Reviewed - No data to display ____________________________________________  EKG   ____________________________________________  RADIOLOGY   ____________________________________________   PROCEDURES  Procedure(s) performed: None  Procedures  Critical Care performed: No  ____________________________________________   INITIAL IMPRESSION / ASSESSMENT AND PLAN / ED COURSE  Pertinent labs & imaging results that were available during my care of the patient were reviewed by me and considered in my medical decision making (see chart for details).  Skin avulsion third digit right hand. Patient given discharge care instruction. Patient given a prescription for Percocets and Neosporin. Patient advised to ER for condition worsens.      ____________________________________________   FINAL CLINICAL IMPRESSION(S) / ED DIAGNOSES  Final diagnoses:  Skin avulsion      NEW MEDICATIONS STARTED DURING THIS VISIT:  New Prescriptions   NEOMYCIN-POLYMYXIN-PRAMOXINE (NEOSPORIN PLUS) 1 % CREAM    Apply topically 2 (two) times daily.   OXYCODONE-ACETAMINOPHEN (PERCOCET) 7.5-325 MG TABLET    Take 1 tablet by mouth every  6 (six) hours as needed for severe pain.     Note:  This document was prepared using Dragon voice recognition software and may include unintentional dictation errors.    Joni Reining, PA-C 11/03/16 1756    Jennye Moccasin, MD 11/03/16 1800

## 2016-11-03 NOTE — ED Triage Notes (Signed)
Pt come into the Ed via POV c/o right middle finger laceration after cutting broccoli.  Patient has all bleeding under control at this time.  Patient in NAD at this time with even and unlabored respirations.

## 2016-11-04 ENCOUNTER — Encounter: Payer: Self-pay | Admitting: Physician Assistant

## 2017-03-09 NOTE — Progress Notes (Signed)
Complete Physical  Assessment and Plan:  Depression, unspecified depression type remission  Insulin resistance Monitor weight  History of thyroid nodule -     TSH  S/P partial thyroidectomy Monitor TSH -     TSH -     TSH; Future  Asthma, chronic, unspecified asthma severity, uncomplicated Avoid triggers, continue meds  Screening cholesterol level -     Lipid panel  Medication management -     CBC with Differential/Platelet -     BASIC METABOLIC PANEL WITH GFR -     Hepatic function panel -     Magnesium  Vitamin D deficiency -     VITAMIN D 25 Hydroxy (Vit-D Deficiency, Fractures)  Routine general medical examination at a health care facility  Screening for blood or protein in urine -     Urinalysis, Routine w reflex microscopic -     Microalbumin / creatinine urine ratio  Screening, anemia, deficiency, iron -     Iron and TIBC -     Ferritin   Future Appointments Date Time Provider Department Center  03/11/2018 2:00 PM Quentin Mulling, PA-C [facility] None    HPI Patient presents for complete physical.   Vitamin D def on medications.  Had benign thyroid nodule removal in August due to compressive symptoms and states she is doing well.  Lab Results  Component Value Date   TSH 1.30 08/19/2016   Has seen Dr. Maple Hudson, had PFTs 09/2013, and thought to have mild asthma, on albuterol, has had some flares with allergies.  She has had a normal cholesterol,  Lab Results  Component Value Date   CHOL 160 02/28/2016   HDL 62 02/28/2016   LDLCALC 88 02/28/2016   TRIG 48 02/28/2016   CHOLHDL 2.6 02/28/2016  In 2014 her A1C was normal at 5.3 but she had an elevated insulin at 30.  Wt Readings from Last 3 Encounters:  03/10/17 166 lb 9.6 oz (75.6 kg)  11/03/16 160 lb (72.6 kg)  10/31/16 168 lb 1.6 oz (76.2 kg)  She is married with 41 son, 61 years old  Current Medications:  Current Outpatient Prescriptions on File Prior to Visit  Medication Sig  . albuterol  (PROVENTIL HFA;VENTOLIN HFA) 108 (90 Base) MCG/ACT inhaler Inhale 2 puffs into the lungs every 2 (two) hours as needed for wheezing or shortness of breath (cough).  Marland Kitchen aspirin 81 MG chewable tablet Chew 81 mg by mouth daily.  . cyclobenzaprine (FLEXERIL) 5 MG tablet Take 2 tablets (10 mg total) by mouth every 8 (eight) hours as needed for muscle spasms.  . fluticasone furoate-vilanterol (BREO ELLIPTA) 100-25 MCG/INH AEPB Inhale 1 puff into the lungs daily. Rinse mouth with water after each use  . levonorgestrel (MIRENA) 20 MCG/24HR IUD 1 each by Intrauterine route once.  . neomycin-polymyxin-pramoxine (NEOSPORIN PLUS) 1 % cream Apply topically 2 (two) times daily.  . montelukast (SINGULAIR) 10 MG tablet Take 1 tablet (10 mg total) by mouth daily.   No current facility-administered medications on file prior to visit.    Health Maintainance:  Immunization History  Administered Date(s) Administered  . Td 08/13/2005  . Tdap 02/22/2015   Tetatus 2016 Pap: 02/2014, due this year Sexually active: yes, STD testing offered but declines.  LMP: on mirena placed 2015 [employer] N/A Colonoscopy N/A Eye Doctor Dr. Hyacinth Meeker Dec 2014, wears glasses, 2 years  Allergies:  No Known Allergies Medical History:  Past Medical History:  Diagnosis Date  . Allergy   . Anxiety   .  Bronchitis    hx of  . Depression   . Heart murmur    born with a heart murmur; pt stated "I havent seen a doctor for it since I was a kid"  . Pneumonia    hx of  . Thyroid nodule    Allergies No Known Allergies  SURGICAL HISTORY She  has a past surgical history that includes Thyroid lobectomy (Left, 05/11/2015) and Thyroidectomy (N/A, 05/11/2015). FAMILY HISTORY Her family history includes Brain cancer in her maternal grandfather; Breast cancer in her maternal aunt; Heart disease in her maternal grandmother. SOCIAL HISTORY She  reports that she quit smoking about 9 years ago. Her smoking use included Cigarettes. She has a 5.50  pack-year smoking history. She has never used smokeless tobacco. She reports that she drinks alcohol. She reports that she does not use drugs.   Surgical History:  Review of Systems  Constitutional: Negative.  Negative for chills and fever.  HENT: Negative for congestion, ear discharge, ear pain, hearing loss, nosebleeds, sore throat and tinnitus.   Respiratory: Negative for cough, hemoptysis, sputum production, shortness of breath, wheezing and stridor.   Cardiovascular: Negative.   Gastrointestinal: Negative.   Genitourinary: Negative.   Musculoskeletal: Negative.   Skin: Negative.  Negative for rash.  Neurological: Negative.  Negative for headaches.  Psychiatric/Behavioral: Negative.     Physical Exam: Estimated body mass index is 26.09 kg/m as calculated from the following:   Height as of this encounter: 5\' 7"  (1.702 m).   Weight as of this encounter: 166 lb 9.6 oz (75.6 kg). Vitals:   03/10/17 1404  BP: 122/80  Pulse: 67  Resp: 16  Temp: 98.1 F (36.7 C)   General Appearance: Well nourished, in no apparent distress. Eyes: PERRLA, EOMs, conjunctiva no swelling or erythema, normal fundi and vessels. Sinuses: No Frontal/maxillary tenderness ENT/Mouth: Ext aud canals clear, normal light reflex with TMs without erythema, bulging.  Good dentition. No erythema, swelling, or exudate on post pharynx. Tonsils not swollen or erythematous. Hearing normal.  Neck: Supple, well healing horizontal thyroid scar. No bruits Respiratory: Respiratory effort normal, BS equal bilaterally without rales, rhonci, wheezing or stridor. Cardio: Heart sounds normal, regular rate and rhythm without murmurs, rubs or gallops. Peripheral pulses brisk and equal bilaterally, without edema.  Chest: symmetric, with normal excursions and percussion. Breasts: defer OB/GYN Abdomen: Flat, soft, with bowl sounds, no tenderness, no guarding, rebound, hernias, masses, or organomegaly. .  Lymphatics: Non tender  without lymphadenopathy Genitourinary: defer Musculoskeletal: Full ROM all peripheral extremities,5/5 strength, and normal gait. Skin: Warm, dry without rashes, lesions, ecchymosis.  Neuro: Cranial nerves intact, reflexes equal bilaterally. Normal muscle tone, no cerebellar symptoms. Sensation intact.  Pysch: Awake and oriented X 3, normal affect, Insight and Judgment appropriate.   EKG:  Normal   Quentin MullingAmanda Elye Harmsen 2:16 PM

## 2017-03-10 ENCOUNTER — Ambulatory Visit (INDEPENDENT_AMBULATORY_CARE_PROVIDER_SITE_OTHER): Payer: BLUE CROSS/BLUE SHIELD | Admitting: Physician Assistant

## 2017-03-10 ENCOUNTER — Encounter: Payer: Self-pay | Admitting: Physician Assistant

## 2017-03-10 VITALS — BP 122/80 | HR 67 | Temp 98.1°F | Resp 16 | Ht 67.0 in | Wt 166.6 lb

## 2017-03-10 DIAGNOSIS — F32A Depression, unspecified: Secondary | ICD-10-CM

## 2017-03-10 DIAGNOSIS — Z0000 Encounter for general adult medical examination without abnormal findings: Secondary | ICD-10-CM

## 2017-03-10 DIAGNOSIS — Z1389 Encounter for screening for other disorder: Secondary | ICD-10-CM

## 2017-03-10 DIAGNOSIS — Z8639 Personal history of other endocrine, nutritional and metabolic disease: Secondary | ICD-10-CM

## 2017-03-10 DIAGNOSIS — Z130 Encounter for screening for diseases of the blood and blood-forming organs and certain disorders involving the immune mechanism: Secondary | ICD-10-CM

## 2017-03-10 DIAGNOSIS — E88819 Insulin resistance, unspecified: Secondary | ICD-10-CM

## 2017-03-10 DIAGNOSIS — F329 Major depressive disorder, single episode, unspecified: Secondary | ICD-10-CM

## 2017-03-10 DIAGNOSIS — Z79899 Other long term (current) drug therapy: Secondary | ICD-10-CM | POA: Diagnosis not present

## 2017-03-10 DIAGNOSIS — E559 Vitamin D deficiency, unspecified: Secondary | ICD-10-CM | POA: Diagnosis not present

## 2017-03-10 DIAGNOSIS — Z9889 Other specified postprocedural states: Secondary | ICD-10-CM

## 2017-03-10 DIAGNOSIS — E890 Postprocedural hypothyroidism: Secondary | ICD-10-CM

## 2017-03-10 DIAGNOSIS — J45909 Unspecified asthma, uncomplicated: Secondary | ICD-10-CM

## 2017-03-10 DIAGNOSIS — Z13220 Encounter for screening for lipoid disorders: Secondary | ICD-10-CM

## 2017-03-10 DIAGNOSIS — E8881 Metabolic syndrome: Secondary | ICD-10-CM

## 2017-03-10 LAB — CBC WITH DIFFERENTIAL/PLATELET
Basophils Absolute: 0 cells/uL (ref 0–200)
Basophils Relative: 0 %
EOS PCT: 5 %
Eosinophils Absolute: 375 cells/uL (ref 15–500)
HCT: 40.4 % (ref 35.0–45.0)
Hemoglobin: 13 g/dL (ref 11.7–15.5)
LYMPHS ABS: 2400 {cells}/uL (ref 850–3900)
LYMPHS PCT: 32 %
MCH: 30.2 pg (ref 27.0–33.0)
MCHC: 32.2 g/dL (ref 32.0–36.0)
MCV: 93.7 fL (ref 80.0–100.0)
MPV: 10 fL (ref 7.5–12.5)
Monocytes Absolute: 600 cells/uL (ref 200–950)
Monocytes Relative: 8 %
NEUTROS PCT: 55 %
Neutro Abs: 4125 cells/uL (ref 1500–7800)
Platelets: 277 10*3/uL (ref 140–400)
RBC: 4.31 MIL/uL (ref 3.80–5.10)
RDW: 13.5 % (ref 11.0–15.0)
WBC: 7.5 10*3/uL (ref 3.8–10.8)

## 2017-03-10 LAB — TSH: TSH: 0.68 mIU/L

## 2017-03-10 NOTE — Patient Instructions (Addendum)
Get your PAP this year  Preventive Care for Adults A healthy lifestyle and preventive care can promote health and wellness. Preventive health guidelines for women include the following key practices.  A routine yearly physical is a good way to check with your health care provider about your health and preventive screening. It is a chance to share any concerns and updates on your health and to receive a thorough exam.  Visit your dentist for a routine exam and preventive care every 6 months. Brush your teeth twice a day and floss once a day. Good oral hygiene prevents tooth decay and gum disease.  The frequency of eye exams is based on your age, health, family medical history, use of contact lenses, and other factors. Follow your health care provider's recommendations for frequency of eye exams.  Eat a healthy diet. Foods like vegetables, fruits, whole grains, low-fat dairy products, and lean protein foods contain the nutrients you need without too many calories. Decrease your intake of foods high in solid fats, added sugars, and salt. Eat the right amount of calories for you.Get information about a proper diet from your health care provider, if necessary.  Regular physical exercise is one of the most important things you can do for your health. Most adults should get at least 150 minutes of moderate-intensity exercise (any activity that increases your heart rate and causes you to sweat) each week. In addition, most adults need muscle-strengthening exercises on 2 or more days a week.  Maintain a healthy weight. The body mass index (BMI) is a screening tool to identify possible weight problems. It provides an estimate of body fat based on height and weight. Your health care provider can find your BMI and can help you achieve or maintain a healthy weight.For adults 20 years and older:  A BMI below 18.5 is considered underweight.  A BMI of 18.5 to 24.9 is normal.  A BMI of 25 to 29.9 is considered  overweight.  A BMI of 30 and above is considered obese.  Maintain normal blood lipids and cholesterol levels by exercising and minimizing your intake of saturated fat. Eat a balanced diet with plenty of fruit and vegetables. Blood tests for lipids and cholesterol should begin at age 33 and be repeated every 5 years. If your lipid or cholesterol levels are high, you are over 50, or you are at high risk for heart disease, you may need your cholesterol levels checked more frequently.Ongoing high lipid and cholesterol levels should be treated with medicines if diet and exercise are not working.  If you smoke, find out from your health care provider how to quit. If you do not use tobacco, do not start.  Lung cancer screening is recommended for adults aged 66-80 years who are at high risk for developing lung cancer because of a history of smoking. A yearly low-dose CT scan of the lungs is recommended for people who have at least a 30-pack-year history of smoking and are a current smoker or have quit within the past 15 years. A pack year of smoking is smoking an average of 1 pack of cigarettes a day for 1 year (for example: 1 pack a day for 30 years or 2 packs a day for 15 years). Yearly screening should continue until the smoker has stopped smoking for at least 15 years. Yearly screening should be stopped for people who develop a health problem that would prevent them from having lung cancer treatment.  If you are pregnant, do not  drink alcohol. If you are breastfeeding, be very cautious about drinking alcohol. If you are not pregnant and choose to drink alcohol, do not have more than 1 drink per day. One drink is considered to be 12 ounces (355 mL) of beer, 5 ounces (148 mL) of wine, or 1.5 ounces (44 mL) of liquor.  Avoid use of street drugs. Do not share needles with anyone. Ask for help if you need support or instructions about stopping the use of drugs.  High blood pressure causes heart disease and  increases the risk of stroke. Your blood pressure should be checked at least every 1 to 2 years. Ongoing high blood pressure should be treated with medicines if weight loss and exercise do not work.  If you are 67-35 years old, ask your health care provider if you should take aspirin to prevent strokes.  Diabetes screening involves taking a blood sample to check your fasting blood sugar level. This should be done once every 3 years, after age 70, if you are within normal weight and without risk factors for diabetes. Testing should be considered at a younger age or be carried out more frequently if you are overweight and have at least 1 risk factor for diabetes.  Breast cancer screening is essential preventive care for women. You should practice "breast self-awareness." This means understanding the normal appearance and feel of your breasts and may include breast self-examination. Any changes detected, no matter how small, should be reported to a health care provider. Women in their 46s and 30s should have a clinical breast exam (CBE) by a health care provider as part of a regular health exam every 1 to 3 years. After age 28, women should have a CBE every year. Starting at age 70, women should consider having a mammogram (breast X-ray test) every year. Women who have a family history of breast cancer should talk to their health care provider about genetic screening. Women at a high risk of breast cancer should talk to their health care providers about having an MRI and a mammogram every year.  Breast cancer gene (BRCA)-related cancer risk assessment is recommended for women who have family members with BRCA-related cancers. BRCA-related cancers include breast, ovarian, tubal, and peritoneal cancers. Having family members with these cancers may be associated with an increased risk for harmful changes (mutations) in the breast cancer genes BRCA1 and BRCA2. Results of the assessment will determine the need for  genetic counseling and BRCA1 and BRCA2 testing.  Routine pelvic exams to screen for cancer are no longer recommended for nonpregnant women who are considered low risk for cancer of the pelvic organs (ovaries, uterus, and vagina) and who do not have symptoms. Ask your health care provider if a screening pelvic exam is right for you.  If you have had past treatment for cervical cancer or a condition that could lead to cancer, you need Pap tests and screening for cancer for at least 20 years after your treatment. If Pap tests have been discontinued, your risk factors (such as having a new sexual partner) need to be reassessed to determine if screening should be resumed. Some women have medical problems that increase the chance of getting cervical cancer. In these cases, your health care provider may recommend more frequent screening and Pap tests.  The HPV test is an additional test that may be used for cervical cancer screening. The HPV test looks for the virus that can cause the cell changes on the cervix. The cells collected  during the Pap test can be tested for HPV. The HPV test could be used to screen women aged 58 years and older, and should be used in women of any age who have unclear Pap test results. After the age of 72, women should have HPV testing at the same frequency as a Pap test.  Colorectal cancer can be detected and often prevented. Most routine colorectal cancer screening begins at the age of 40 years and continues through age 77 years. However, your health care provider may recommend screening at an earlier age if you have risk factors for colon cancer. On a yearly basis, your health care provider may provide home test kits to check for hidden blood in the stool. Use of a small camera at the end of a tube, to directly examine the colon (sigmoidoscopy or colonoscopy), can detect the earliest forms of colorectal cancer. Talk to your health care provider about this at age 71, when routine  screening begins. Direct exam of the colon should be repeated every 5-10 years through age 65 years, unless early forms of pre-cancerous polyps or small growths are found.  People who are at an increased risk for hepatitis B should be screened for this virus. You are considered at high risk for hepatitis B if:  You were born in a country where hepatitis B occurs often. Talk with your health care provider about which countries are considered high risk.  Your parents were born in a high-risk country and you have not received a shot to protect against hepatitis B (hepatitis B vaccine).  You have HIV or AIDS.  You use needles to inject street drugs.  You live with, or have sex with, someone who has hepatitis B.  You get hemodialysis treatment.  You take certain medicines for conditions like cancer, organ transplantation, and autoimmune conditions.  Hepatitis C blood testing is recommended for all people born from [birth year] through 1967 and any individual with known risks for hepatitis C.  Practice safe sex. Use condoms and avoid high-risk sexual practices to reduce the spread of sexually transmitted infections (STIs). STIs include gonorrhea, chlamydia, syphilis, trichomonas, herpes, HPV, and human immunodeficiency virus (HIV). Herpes, HIV, and HPV are viral illnesses that have no cure. They can result in disability, cancer, and death.  You should be screened for sexually transmitted illnesses (STIs) including gonorrhea and chlamydia if:  You are sexually active and are younger than 24 years.  You are older than 24 years and your health care provider tells you that you are at risk for this type of infection.  Your sexual activity has changed since you were last screened and you are at an increased risk for chlamydia or gonorrhea. Ask your health care provider if you are at risk.  If you are at risk of being infected with HIV, it is recommended that you take a prescription medicine daily to  prevent HIV infection. This is called preexposure prophylaxis (PrEP). You are considered at risk if:  You are a heterosexual woman, are sexually active, and are at increased risk for HIV infection.  You take drugs by injection.  You are sexually active with a partner who has HIV.  Talk with your health care provider about whether you are at high risk of being infected with HIV. If you choose to begin PrEP, you should first be tested for HIV. You should then be tested every 3 months for as long as you are taking PrEP.  Osteoporosis is a disease in which  the bones lose minerals and strength with aging. This can result in serious bone fractures or breaks. The risk of osteoporosis can be identified using a bone density scan. Women ages 73 years and over and women at risk for fractures or osteoporosis should discuss screening with their health care providers. Ask your health care provider whether you should take a calcium supplement or vitamin D to reduce the rate of osteoporosis.  Menopause can be associated with physical symptoms and risks. Hormone replacement therapy is available to decrease symptoms and risks. You should talk to your health care provider about whether hormone replacement therapy is right for you.  Use sunscreen. Apply sunscreen liberally and repeatedly throughout the day. You should seek shade when your shadow is shorter than you. Protect yourself by wearing long sleeves, pants, a wide-brimmed hat, and sunglasses year round, whenever you are outdoors.  Once a month, do a whole body skin exam, using a mirror to look at the skin on your back. Tell your health care provider of new moles, moles that have irregular borders, moles that are larger than a pencil eraser, or moles that have changed in shape or color.  Stay current with required vaccines (immunizations).  Influenza vaccine. All adults should be immunized every year.  Tetanus, diphtheria, and acellular pertussis (Td, Tdap)  vaccine. Pregnant women should receive 1 dose of Tdap vaccine during each pregnancy. The dose should be obtained regardless of the length of time since the last dose. Immunization is preferred during the 27th-36th week of gestation. An adult who has not previously received Tdap or who does not know her vaccine status should receive 1 dose of Tdap. This initial dose should be followed by tetanus and diphtheria toxoids (Td) booster doses every 10 years. Adults with an unknown or incomplete history of completing a 3-dose immunization series with Td-containing vaccines should begin or complete a primary immunization series including a Tdap dose. Adults should receive a Td booster every 10 years.  Varicella vaccine. An adult without evidence of immunity to varicella should receive 2 doses or a second dose if she has previously received 1 dose. Pregnant females who do not have evidence of immunity should receive the first dose after pregnancy. This first dose should be obtained before leaving the health care facility. The second dose should be obtained 4-8 weeks after the first dose.  Human papillomavirus (HPV) vaccine. Females aged 13-26 years who have not received the vaccine previously should obtain the 3-dose series. The vaccine is not recommended for use in pregnant females. However, pregnancy testing is not needed before receiving a dose. If a female is found to be pregnant after receiving a dose, no treatment is needed. In that case, the remaining doses should be delayed until after the pregnancy. Immunization is recommended for any person with an immunocompromised condition through the age of 9 years if she did not get any or all doses earlier. During the 3-dose series, the second dose should be obtained 4-8 weeks after the first dose. The third dose should be obtained 24 weeks after the first dose and 16 weeks after the second dose.  Zoster vaccine. One dose is recommended for adults aged 63 years or older  unless certain conditions are present.  Measles, mumps, and rubella (MMR) vaccine. Adults born before 10 generally are considered immune to measles and mumps. Adults born in [birth year] or later should have 1 or more doses of MMR vaccine unless there is a contraindication to the vaccine or there  is laboratory evidence of immunity to each of the three diseases. A routine second dose of MMR vaccine should be obtained at least 28 days after the first dose for students attending postsecondary schools, health care workers, or international travelers. People who received inactivated measles vaccine or an unknown type of measles vaccine during 1963-1967 should receive 2 doses of MMR vaccine. People who received inactivated mumps vaccine or an unknown type of mumps vaccine before 1979 and are at high risk for mumps infection should consider immunization with 2 doses of MMR vaccine. For females of childbearing age, rubella immunity should be determined. If there is no evidence of immunity, females who are not pregnant should be vaccinated. If there is no evidence of immunity, females who are pregnant should delay immunization until after pregnancy. Unvaccinated health care workers born before 40 who lack laboratory evidence of measles, mumps, or rubella immunity or laboratory confirmation of disease should consider measles and mumps immunization with 2 doses of MMR vaccine or rubella immunization with 1 dose of MMR vaccine.  Pneumococcal 13-valent conjugate (PCV13) vaccine. When indicated, a person who is uncertain of her immunization history and has no record of immunization should receive the PCV13 vaccine. An adult aged 52 years or older who has certain medical conditions and has not been previously immunized should receive 1 dose of PCV13 vaccine. This PCV13 should be followed with a dose of pneumococcal polysaccharide (PPSV23) vaccine. The PPSV23 vaccine dose should be obtained at least 8 weeks after the dose of PCV13  vaccine. An adult aged 77 years or older who has certain medical conditions and previously received 1 or more doses of PPSV23 vaccine should receive 1 dose of PCV13. The PCV13 vaccine dose should be obtained 1 or more years after the last PPSV23 vaccine dose.  Pneumococcal polysaccharide (PPSV23) vaccine. When PCV13 is also indicated, PCV13 should be obtained first. All adults aged 54 years and older should be immunized. An adult younger than age 50 years who has certain medical conditions should be immunized. Any person who resides in a nursing home or long-term care facility should be immunized. An adult smoker should be immunized. People with an immunocompromised condition and certain other conditions should receive both PCV13 and PPSV23 vaccines. People with human immunodeficiency virus (HIV) infection should be immunized as soon as possible after diagnosis. Immunization during chemotherapy or radiation therapy should be avoided. Routine use of PPSV23 vaccine is not recommended for American Indians, [place] Natives, or people younger than 65 years unless there are medical conditions that require PPSV23 vaccine. When indicated, people who have unknown immunization and have no record of immunization should receive PPSV23 vaccine. One-time revaccination 5 years after the first dose of PPSV23 is recommended for people aged 19-64 years who have chronic kidney failure, nephrotic syndrome, asplenia, or immunocompromised conditions. People who received 1-2 doses of PPSV23 before age 70 years should receive another dose of PPSV23 vaccine at age 30 years or later if at least 5 years have passed since the previous dose. Doses of PPSV23 are not needed for people immunized with PPSV23 at or after age 59 years.  Meningococcal vaccine. Adults with asplenia or persistent complement component deficiencies should receive 2 doses of quadrivalent meningococcal conjugate (MenACWY-D) vaccine. The doses should be obtained at least  2 months apart. Microbiologists working with certain meningococcal bacteria, [place] recruits, people at risk during an outbreak, and people who travel to or live in countries with a high rate of meningitis should be immunized. A  first-year college student up through age 78 years who is living in a residence hall should receive a dose if she did not receive a dose on or after her 16th birthday. Adults who have certain high-risk conditions should receive one or more doses of vaccine.  Hepatitis A vaccine. Adults who wish to be protected from this disease, have certain high-risk conditions, work with hepatitis A-infected animals, work in hepatitis A research labs, or travel to or work in countries with a high rate of hepatitis A should be immunized. Adults who were previously unvaccinated and who anticipate close contact with an international adoptee during the first 60 days after arrival in the [place] from a country with a high rate of hepatitis A should be immunized.  Hepatitis B vaccine. Adults who wish to be protected from this disease, have certain high-risk conditions, may be exposed to blood or other infectious body fluids, are household contacts or sex partners of hepatitis B positive people, are clients or workers in certain care facilities, or travel to or work in countries with a high rate of hepatitis B should be immunized.  Haemophilus influenzae type b (Hib) vaccine. A previously unvaccinated person with asplenia or sickle cell disease or having a scheduled splenectomy should receive 1 dose of Hib vaccine. Regardless of previous immunization, a recipient of a hematopoietic stem cell transplant should receive a 3-dose series 6-12 months after her successful transplant. Hib vaccine is not recommended for adults with HIV infection. Preventive Services / Frequency  Ages 89 to 81 years 1. Blood pressure check. 2. Lipid and cholesterol check. 3. Clinical breast exam.** / Every 3 years for  women in their 78s and 30s. 4. BRCA-related cancer risk assessment.** / For women who have family members with a BRCA-related cancer (breast, ovarian, tubal, or peritoneal cancers). 5. Pap test.** / Every 2 years from ages 96 through 14. Every 3 years starting at age 71 through age 24 or 9 with a history of 3 consecutive normal Pap tests. 6. HPV screening.** / Every 3 years from ages 60 through ages 8 to 53 with a history of 3 consecutive normal Pap tests. 7. Hepatitis C blood test.** / For any individual with known risks for hepatitis C. 8. Skin self-exam. / Monthly. 9. Influenza vaccine. / Every year. 10. Tetanus, diphtheria, and acellular pertussis (Tdap, Td) vaccine.** / Consult your health care provider. Pregnant women should receive 1 dose of Tdap vaccine during each pregnancy. 1 dose of Td every 10 years. 11. Varicella vaccine.** / Consult your health care provider. Pregnant females who do not have evidence of immunity should receive the first dose after pregnancy. 12. HPV vaccine. / 3 doses over 6 months, if 10 and younger. The vaccine is not recommended for use in pregnant females. However, pregnancy testing is not needed before receiving a dose. 13. Measles, mumps, rubella (MMR) vaccine.** / You need at least 1 dose of MMR if you were born in 1957 or later. You may also need a 2nd dose. For females of childbearing age, rubella immunity should be determined. If there is no evidence of immunity, females who are not pregnant should be vaccinated. If there is no evidence of immunity, females who are pregnant should delay immunization until after pregnancy. 14. Pneumococcal 13-valent conjugate (PCV13) vaccine.** / Consult your health care provider. 15. Pneumococcal polysaccharide (PPSV23) vaccine.** / 1 to 2 doses if you smoke cigarettes or if you have certain conditions. 16. Meningococcal vaccine.** / 1 dose if you are age  17 to 21 years and a [employer] researcher living in a residence  hall, or have one of several medical conditions, you need to get vaccinated against meningococcal disease. You may also need additional booster doses. 17. Hepatitis A vaccine.** / Consult your health care provider. 18. Hepatitis B vaccine.** / Consult your health care provider. 19. Haemophilus influenzae type b (Hib) vaccine.** / Consult your health care provider.  24. Chlamydia, HIV, and other sexually transmitted diseases- Get screened every year until age 15, then within three months of each new sexual provider. 21. Pap Smear- Every 1-3 years; discuss with your health care provider. 61. Mammogram- Every year starting at age 82  Take these steps 1. Do not smoke-Your healthcare provider can help you quit.  For tips on how to quit go to www.smokefree.gov or call 1-800 QUITNOW. 2. Be physically active- Exercise 5 days a week for at least 30 minutes.  If you are not already physically active, start slow and gradually work up to 30 minutes of moderate physical activity.  Examples of moderate activity include walking briskly, dancing, swimming, bicycling, etc. 3. Breast Cancer- A self breast exam every month is important for early detection of breast cancer.  For more information and instruction on self breast exams, ask your healthcare provider or https://www.patel.info/. 4. Eat a healthy diet- Eat a variety of healthy foods such as fruits, vegetables, whole grains, low fat milk, low fat cheeses, yogurt, lean meats, poultry and fish, beans, nuts, tofu, etc.  For more information go to www. Thenutritionsource.org 5. Drink alcohol in moderation- Limit alcohol intake to one drink or less per day. Never drink and drive. 6. Depression- Your emotional health is as important as your physical health.  If you're feeling down or losing interest in things you normally enjoy please talk to your healthcare provider about being screened for depression. 7. Dental visit- Brush and floss your teeth  twice daily; visit your dentist twice a year. 8. Eye doctor- Get an eye exam at least every 2 years. 9. Helmet use- Always wear a helmet when riding a bicycle, motorcycle, rollerblading or skateboarding. 46. Safe sex- If you may be exposed to sexually transmitted infections, use a condom. 11. Seat belts- Seat belts can save your live; always wear one. 12. Smoke/Carbon Monoxide detectors- These detectors need to be installed on the appropriate level of your home. Replace batteries at least once a year. 13. Skin cancer- When out in the sun please cover up and use sunscreen 15 SPF or higher. 14. Violence- If anyone is threatening or hurting you, please tell your healthcare provider.

## 2017-03-11 LAB — HEPATIC FUNCTION PANEL
ALK PHOS: 61 U/L (ref 33–115)
ALT: 14 U/L (ref 6–29)
AST: 17 U/L (ref 10–30)
Albumin: 4.5 g/dL (ref 3.6–5.1)
BILIRUBIN INDIRECT: 0.3 mg/dL (ref 0.2–1.2)
Bilirubin, Direct: 0.1 mg/dL (ref <=0.2)
TOTAL PROTEIN: 7.2 g/dL (ref 6.1–8.1)
Total Bilirubin: 0.4 mg/dL (ref 0.2–1.2)

## 2017-03-11 LAB — MICROALBUMIN / CREATININE URINE RATIO
CREATININE, URINE: 83 mg/dL (ref 20–320)
Microalb Creat Ratio: 4 mcg/mg creat (ref <30)
Microalb, Ur: 0.3 mg/dL

## 2017-03-11 LAB — IRON AND TIBC
%SAT: 35 % (ref 11–50)
Iron: 112 ug/dL (ref 40–190)
TIBC: 324 ug/dL (ref 250–450)
UIBC: 212 ug/dL

## 2017-03-11 LAB — LIPID PANEL
Cholesterol: 161 mg/dL (ref <200)
HDL: 63 mg/dL (ref >50)
LDL CALC: 75 mg/dL (ref <100)
Total CHOL/HDL Ratio: 2.6 Ratio (ref <5.0)
Triglycerides: 115 mg/dL (ref <150)
VLDL: 23 mg/dL (ref <30)

## 2017-03-11 LAB — BASIC METABOLIC PANEL WITH GFR
BUN: 16 mg/dL (ref 7–25)
CALCIUM: 9.5 mg/dL (ref 8.6–10.2)
CO2: 22 mmol/L (ref 20–31)
Chloride: 103 mmol/L (ref 98–110)
Creat: 0.81 mg/dL (ref 0.50–1.10)
Glucose, Bld: 83 mg/dL (ref 65–99)
Potassium: 4.2 mmol/L (ref 3.5–5.3)
SODIUM: 139 mmol/L (ref 135–146)

## 2017-03-11 LAB — URINALYSIS, ROUTINE W REFLEX MICROSCOPIC
BILIRUBIN URINE: NEGATIVE
Glucose, UA: NEGATIVE
Hgb urine dipstick: NEGATIVE
KETONES UR: NEGATIVE
Leukocytes, UA: NEGATIVE
Nitrite: NEGATIVE
PROTEIN: NEGATIVE
Specific Gravity, Urine: 1.019 (ref 1.001–1.035)
pH: 7.5 (ref 5.0–8.0)

## 2017-03-11 LAB — FERRITIN: FERRITIN: 113 ng/mL (ref 10–154)

## 2017-03-11 LAB — VITAMIN D 25 HYDROXY (VIT D DEFICIENCY, FRACTURES): VIT D 25 HYDROXY: 36 ng/mL (ref 30–100)

## 2017-03-11 LAB — MAGNESIUM: Magnesium: 1.9 mg/dL (ref 1.5–2.5)

## 2017-03-17 ENCOUNTER — Encounter: Payer: Self-pay | Admitting: Physician Assistant

## 2017-05-08 ENCOUNTER — Other Ambulatory Visit: Payer: Self-pay | Admitting: Physician Assistant

## 2017-05-08 ENCOUNTER — Encounter: Payer: Self-pay | Admitting: Physician Assistant

## 2017-05-08 MED ORDER — PREDNISONE 20 MG PO TABS: ORAL_TABLET | ORAL | 0 refills | Status: DC

## 2017-07-24 ENCOUNTER — Encounter: Payer: Self-pay | Admitting: Physician Assistant

## 2017-07-24 DIAGNOSIS — J069 Acute upper respiratory infection, unspecified: Secondary | ICD-10-CM

## 2017-07-24 MED ORDER — ALBUTEROL SULFATE HFA 108 (90 BASE) MCG/ACT IN AERS: 2.0000 | INHALATION_SPRAY | RESPIRATORY_TRACT | 3 refills | Status: DC | PRN

## 2017-09-03 ENCOUNTER — Other Ambulatory Visit: Payer: BLUE CROSS/BLUE SHIELD

## 2017-09-03 DIAGNOSIS — Z9889 Other specified postprocedural states: Secondary | ICD-10-CM

## 2017-09-03 DIAGNOSIS — E890 Postprocedural hypothyroidism: Secondary | ICD-10-CM | POA: Diagnosis not present

## 2017-09-03 LAB — TSH: TSH: 1.05 mIU/L

## 2017-09-09 ENCOUNTER — Other Ambulatory Visit: Payer: Self-pay

## 2017-10-15 ENCOUNTER — Other Ambulatory Visit: Payer: Self-pay | Admitting: Physician Assistant

## 2017-10-15 ENCOUNTER — Encounter: Payer: Self-pay | Admitting: Physician Assistant

## 2017-10-15 MED ORDER — AZITHROMYCIN 250 MG PO TABS: ORAL_TABLET | ORAL | 1 refills | Status: AC

## 2017-11-12 DIAGNOSIS — Z30431 Encounter for routine checking of intrauterine contraceptive device: Secondary | ICD-10-CM | POA: Diagnosis not present

## 2017-11-12 DIAGNOSIS — Z01419 Encounter for gynecological examination (general) (routine) without abnormal findings: Secondary | ICD-10-CM | POA: Diagnosis not present

## 2017-11-12 DIAGNOSIS — Z1389 Encounter for screening for other disorder: Secondary | ICD-10-CM | POA: Diagnosis not present

## 2017-11-12 DIAGNOSIS — Z6826 Body mass index (BMI) 26.0-26.9, adult: Secondary | ICD-10-CM | POA: Diagnosis not present

## 2017-11-12 DIAGNOSIS — Z130 Encounter for screening for diseases of the blood and blood-forming organs and certain disorders involving the immune mechanism: Secondary | ICD-10-CM | POA: Diagnosis not present

## 2017-11-12 DIAGNOSIS — Z124 Encounter for screening for malignant neoplasm of cervix: Secondary | ICD-10-CM | POA: Diagnosis not present

## 2017-11-12 DIAGNOSIS — Z1151 Encounter for screening for human papillomavirus (HPV): Secondary | ICD-10-CM | POA: Diagnosis not present

## 2017-11-13 DIAGNOSIS — Z124 Encounter for screening for malignant neoplasm of cervix: Secondary | ICD-10-CM | POA: Diagnosis not present

## 2018-03-11 ENCOUNTER — Encounter: Payer: Self-pay | Admitting: Physician Assistant

## 2018-05-26 ENCOUNTER — Ambulatory Visit (INDEPENDENT_AMBULATORY_CARE_PROVIDER_SITE_OTHER): Payer: BLUE CROSS/BLUE SHIELD | Admitting: Physician Assistant

## 2018-05-26 ENCOUNTER — Encounter: Payer: Self-pay | Admitting: Physician Assistant

## 2018-05-26 DIAGNOSIS — J069 Acute upper respiratory infection, unspecified: Secondary | ICD-10-CM | POA: Diagnosis not present

## 2018-05-26 MED ORDER — AZITHROMYCIN 250 MG PO TABS: ORAL_TABLET | ORAL | 1 refills | Status: AC

## 2018-05-26 MED ORDER — PREDNISONE 20 MG PO TABS: ORAL_TABLET | ORAL | 0 refills | Status: DC

## 2018-05-26 MED ORDER — FLUTICASONE PROPIONATE 50 MCG/ACT NA SUSP: 2.0000 | Freq: Every day | NASAL | 1 refills | Status: DC

## 2018-05-26 NOTE — Patient Instructions (Signed)
allergy pill and prednisone for a few days, you always want to give your body 10 days to fight off infection with help before taking an anabiotic.  Antibiotics have been linked with colon infections, resistance and newest theory is colon cancer in 40-50 year olds. So it is VERY important to try to avoid them, antibiotics are NOT risk free medications.  If you are not feeling better make an office visit.  Here is more info below HOW TO TREAT VIRAL COUGH AND COLD SYMPTOMS:  -Symptoms usually last at least 1 week with the worst symptoms being around day 4.  - colds usually start with a sore throat and end with a cough, and the cough can take 2 weeks to get better.  -No antibiotics are needed for colds, flu, sore throats, cough, bronchitis UNLESS symptoms are longer than 7 days OR if you are getting better then get drastically worse.  -There are a lot of combination medications (Dayquil, Nyquil, Vicks 44, tyelnol cold and sinus, ETC). Please look at the ingredients on the back so that you are treating the correct symptoms and not doubling up on medications/ingredients.    Medicines you can use  Nasal congestion  Little Remedies saline spray (aerosol/mist)- can try this, it is in the kids section - pseudoephedrine (Sudafed)- behind the counter, do not use if you have high blood pressure, medicine that have -D in them.  - phenylephrine (Sudafed PE) -Dextormethorphan + chlorpheniramine (Coridcidin HBP)- okay if you have high blood pressure -Oxymetazoline (Afrin) nasal spray- LIMIT to 3 days -Saline nasal spray -Neti pot (used distilled or bottled water)  Ear pain/congestion  -pseudoephedrine (sudafed) - Nasonex/flonase nasal spray  Fever  -Acetaminophen (Tyelnol) -Ibuprofen (Advil, motrin, aleve)  Sore Throat  -Acetaminophen (Tyelnol) -Ibuprofen (Advil, motrin, aleve) -Drink a lot of water -Gargle with salt water - Rest your voice (don't talk) -Throat sprays -Cough drops  Body  Aches  -Acetaminophen (Tyelnol) -Ibuprofen (Advil, motrin, aleve)  Headache  -Acetaminophen (Tyelnol) -Ibuprofen (Advil, motrin, aleve) - Exedrin, Exedrin Migraine  Allergy symptoms (cough, sneeze, runny nose, itchy eyes) -Claritin or loratadine cheapest but likely the weakest  -Zyrtec or certizine at night because it can make you sleepy -The strongest is allegra or fexafinadine  Cheapest at [employer], [employer], [employer]  Cough  -Dextromethorphan (Delsym)- medicine that has DM in it -Guafenesin (Mucinex/Robitussin) - cough drops - drink lots of water  Chest Congestion  -Guafenesin (Mucinex/Robitussin)  Red Itchy Eyes  - Naphcon-A  Upset Stomach  - Bland diet (nothing spicy, greasy, fried, and high acid foods like tomatoes, oranges, berries) -OKAY- cereal, bread, soup, crackers, rice -Eat smaller more frequent meals -reduce caffeine, no alcohol -Loperamide (Imodium-AD) if diarrhea -Prevacid for heart burn  General health when sick  -Hydration -wash your hands frequently -keep surfaces clean -change pillow cases and sheets often -Get fresh air but do not exercise strenuously -Vitamin D, double up on it - Vitamin C -Zinc

## 2018-05-26 NOTE — Progress Notes (Signed)
Subjective:    Patient ID: Jennifer Ponce, female    DOB: Aug 31, 1981, 37 y.o.   MRN: [id number]  HPI 37 y.o. WF presents with cold symptoms x Sunday.  She states her throat hurt yesterday AM, runny nose, sneezing, some congestion, mild cough. She took 1 prednisone yesterday and today and stopped.   Blood pressure 111/71, pulse 64, temperature 98 F (36.7 C), resp. rate 16, weight 166 lb 3.2 oz (75.4 kg), last menstrual period 05/18/2018, SpO2 96 %.  Medications Current Outpatient Medications on File Prior to Visit  Medication Sig  . albuterol (PROVENTIL HFA;VENTOLIN HFA) 108 (90 Base) MCG/ACT inhaler Inhale 2 puffs into the lungs every 2 (two) hours as needed for wheezing or shortness of breath (cough).  Marland Kitchen. aspirin 81 MG chewable tablet Chew 81 mg by mouth daily.  . cyclobenzaprine (FLEXERIL) 5 MG tablet Take 2 tablets (10 mg total) by mouth every 8 (eight) hours as needed for muscle spasms.  . fluticasone furoate-vilanterol (BREO ELLIPTA) 100-25 MCG/INH AEPB Inhale 1 puff into the lungs daily. Rinse mouth with water after each use  . levonorgestrel (MIRENA) 20 MCG/24HR IUD 1 each by Intrauterine route once.  . neomycin-polymyxin-pramoxine (NEOSPORIN PLUS) 1 % cream Apply topically 2 (two) times daily.  . predniSONE (DELTASONE) 20 MG tablet 2 tablets daily for 3 days, 1 tablet daily for 4 days.  . montelukast (SINGULAIR) 10 MG tablet Take 1 tablet (10 mg total) by mouth daily.   No current facility-administered medications on file prior to visit.     Problem list She has Dyspnea; Clinical depression; Insulin resistance; History of thyroid nodule; and S/P partial thyroidectomy on their problem list.   Review of Systems  Constitutional: Negative for chills and diaphoresis.  HENT: Positive for congestion, postnasal drip, sinus pressure and sneezing. Negative for ear pain and sore throat.   Respiratory: Positive for cough. Negative for chest tightness, shortness of breath and  wheezing.   Cardiovascular: Negative.   Gastrointestinal: Negative.   Genitourinary: Negative.   Musculoskeletal: Negative for neck pain.  Neurological: Positive for headaches.       Objective:   Physical Exam  Constitutional: She appears well-developed and well-nourished.  HENT:  Head: Normocephalic and atraumatic.  Right Ear: External ear normal.  Left Ear: External ear normal.  Mouth/Throat: Uvula is midline and mucous membranes are normal. Posterior oropharyngeal edema and posterior oropharyngeal erythema present.  Eyes: Pupils are equal, round, and reactive to light. Conjunctivae and EOM are normal.  Neck: Normal range of motion. Neck supple.  Cardiovascular: Normal rate, regular rhythm and normal heart sounds.  Pulmonary/Chest: Effort normal and breath sounds normal.  Abdominal: Soft. Bowel sounds are normal.  Lymphadenopathy:    She has cervical adenopathy.  Skin: Skin is warm and dry.          Assessment & Plan:  Jennifer Ponce was seen today for acute visit, other, cough and nasal congestion.  Diagnoses and all orders for this visit:  Acute URI -     fluticasone (FLONASE) 50 MCG/ACT nasal spray; Place 2 sprays into both nostrils daily. -     azithromycin (ZITHROMAX) 250 MG tablet; Take 2 tablets (500 mg) on  Day 1,  followed by 1 tablet (250 mg) once daily on Days 2 through 5. -     predniSONE (DELTASONE) 20 MG tablet; 2 tablets daily for 3 days, 1 tablet daily for 4 days. Discussed the importance of avoiding unnecessary antibiotic therapy. Suggested symptomatic OTC remedies. Nasal saline spray  for congestion. Nasal steroids, allergy pill, oral steroids offered  Follow up as needed.     No future appointments. Need CPE

## 2018-05-27 ENCOUNTER — Ambulatory Visit: Payer: Self-pay | Admitting: Physician Assistant

## 2018-06-09 MED ORDER — AZELASTINE HCL 0.1 % NA SOLN: 2.0000 | Freq: Two times a day (BID) | NASAL | 2 refills | Status: DC

## 2018-07-07 ENCOUNTER — Encounter: Payer: Self-pay | Admitting: Physician Assistant

## 2018-07-07 NOTE — Progress Notes (Signed)
Complete Physical  Assessment and Plan:  Depression, unspecified depression type remission  Insulin resistance Monitor weight  History of thyroid nodule -     TSH  S/P partial thyroidectomy Monitor TSH  Asthma, chronic, unspecified asthma severity, uncomplicated Avoid triggers, continue meds  Screening cholesterol level -     Lipid panel  Medication management -     CBC with Differential/Platelet -     BASIC METABOLIC PANEL WITH GFR -     Hepatic function panel -     Magnesium  Vitamin D deficiency -     VITAMIN D 25 Hydroxy (Vit-D Deficiency, Fractures)  Routine general medical examination at a health care facility  Screening for blood or protein in urine -     Urinalysis, Routine w reflex microscopic -     Microalbumin / creatinine urine ratio    Future Appointments  Date Time Provider Department Center  07/08/2018 11:00 AM Quentin Mulling, PA-C [facility] None  07/12/2019 10:00 AM Quentin Mulling, PA-C [facility] None    HPI Patient presents for complete physical.   Vitamin D def on medications.  Had benign thyroid nodule removal in August due to compressive symptoms and states she is doing well.  Lab Results  Component Value Date   TSH 1.05 09/03/2017   Has seen Dr. Maple Hudson, had PFTs 09/2013, and thought to have mild asthma, on albuterol, has had some flares with allergies.  She has had a normal cholesterol,  Lab Results  Component Value Date   CHOL 161 03/10/2017   HDL 63 03/10/2017   LDLCALC 75 03/10/2017   TRIG 115 03/10/2017   CHOLHDL 2.6 03/10/2017  In 2014 her A1C was normal at 5.3 but she had an elevated insulin at 30.  Wt Readings from Last 3 Encounters:  05/26/18 166 lb 3.2 oz (75.4 kg)  03/10/17 166 lb 9.6 oz (75.6 kg)  11/03/16 160 lb (72.6 kg)  She is married with 72 son, 57 years old  Current Medications:  Current Outpatient Medications on File Prior to Visit  Medication Sig  . albuterol (PROVENTIL HFA;VENTOLIN HFA) 108 (90 Base)  MCG/ACT inhaler Inhale 2 puffs into the lungs every 2 (two) hours as needed for wheezing or shortness of breath (cough).  Marland Kitchen aspirin 81 MG chewable tablet Chew 81 mg by mouth daily.  Marland Kitchen azelastine (ASTELIN) 0.1 % nasal spray Place 2 sprays into both nostrils 2 (two) times daily. Use in each nostril as directed  . cyclobenzaprine (FLEXERIL) 5 MG tablet Take 2 tablets (10 mg total) by mouth every 8 (eight) hours as needed for muscle spasms.  . fluticasone (FLONASE) 50 MCG/ACT nasal spray Place 2 sprays into both nostrils daily.  . fluticasone furoate-vilanterol (BREO ELLIPTA) 100-25 MCG/INH AEPB Inhale 1 puff into the lungs daily. Rinse mouth with water after each use  . levonorgestrel (MIRENA) 20 MCG/24HR IUD 1 each by Intrauterine route once.  . montelukast (SINGULAIR) 10 MG tablet Take 1 tablet (10 mg total) by mouth daily.  Marland Kitchen neomycin-polymyxin-pramoxine (NEOSPORIN PLUS) 1 % cream Apply topically 2 (two) times daily.  . predniSONE (DELTASONE) 20 MG tablet 2 tablets daily for 3 days, 1 tablet daily for 4 days.   No current facility-administered medications on file prior to visit.    Health Maintainance:  Immunization History  Administered Date(s) Administered  . Td 08/13/2005  . Tdap 02/22/2015   Tetatus 2016 Pap: 02/2018, Dr. Ellyn Hack Sexually active: yes, STD testing offered but declines.  LMP: on mirena placed 2015 [employer] N/A  Colonoscopy N/A Eye Doctor Dr. Hyacinth Meeker Dec 2014, wears glasses, 2 years  Allergies:  No Known Allergies Medical History:  Past Medical History:  Diagnosis Date  . Allergy   . Anxiety   . Bronchitis    hx of  . Depression   . Heart murmur    born with a heart murmur; pt stated "I havent seen a doctor for it since I was a kid"  . History of thyroid nodule 06/23/2015  . Pneumonia    hx of  . Thyroid nodule    Allergies No Known Allergies  SURGICAL HISTORY She  has a past surgical history that includes Thyroid lobectomy (Left, 05/11/2015) and Thyroidectomy  (N/A, 05/11/2015). FAMILY HISTORY Her family history includes Brain cancer in her maternal grandfather; Breast cancer in her maternal aunt; Heart disease in her maternal grandmother. SOCIAL HISTORY She  reports that she quit smoking about 10 years ago. Her smoking use included cigarettes. She has a 5.50 pack-year smoking history. She has never used smokeless tobacco. She reports that she drinks alcohol. She reports that she does not use drugs.   Surgical History:  Review of Systems  Constitutional: Negative.  Negative for chills and fever.  HENT: Negative for congestion, ear discharge, ear pain, hearing loss, nosebleeds, sore throat and tinnitus.   Respiratory: Negative for cough, hemoptysis, sputum production, shortness of breath, wheezing and stridor.   Cardiovascular: Negative.   Gastrointestinal: Negative.   Genitourinary: Negative.   Musculoskeletal: Negative.   Skin: Negative.  Negative for rash.  Neurological: Negative.  Negative for headaches.  Psychiatric/Behavioral: Negative.     Physical Exam: Estimated body mass index is 26.03 kg/m as calculated from the following:   Height as of 03/10/17: 5\' 7"  (1.702 m).   Weight as of 05/26/18: 166 lb 3.2 oz (75.4 kg). There were no vitals filed for this visit. General Appearance: Well nourished, in no apparent distress. Eyes: PERRLA, EOMs, conjunctiva no swelling or erythema, normal fundi and vessels. Sinuses: No Frontal/maxillary tenderness ENT/Mouth: Ext aud canals clear, normal light reflex with TMs without erythema, bulging.  Good dentition. No erythema, swelling, or exudate on post pharynx. Tonsils not swollen or erythematous. Hearing normal.  Neck: Supple, well healing horizontal thyroid scar. No bruits Respiratory: Respiratory effort normal, BS equal bilaterally without rales, rhonci, wheezing or stridor. Cardio: Heart sounds normal, regular rate and rhythm without murmurs, rubs or gallops. Peripheral pulses brisk and equal  bilaterally, without edema.  Chest: symmetric, with normal excursions and percussion. Breasts: defer OB/GYN Abdomen: Flat, soft, with bowl sounds, no tenderness, no guarding, rebound, hernias, masses, or organomegaly. .  Lymphatics: Non tender without lymphadenopathy Genitourinary: defer Musculoskeletal: Full ROM all peripheral extremities,5/5 strength, and normal gait. Skin: Warm, dry without rashes, lesions, ecchymosis.  Neuro: Cranial nerves intact, reflexes equal bilaterally. Normal muscle tone, no cerebellar symptoms. Sensation intact.  Pysch: Awake and oriented X 3, normal affect, Insight and Judgment appropriate.   EKG:  Normal   Quentin Mulling 12:49 PM

## 2018-07-08 ENCOUNTER — Ambulatory Visit (INDEPENDENT_AMBULATORY_CARE_PROVIDER_SITE_OTHER): Payer: BLUE CROSS/BLUE SHIELD | Admitting: Physician Assistant

## 2018-07-08 ENCOUNTER — Encounter: Payer: Self-pay | Admitting: Physician Assistant

## 2018-07-08 VITALS — BP 118/74 | HR 61 | Temp 98.2°F | Resp 16 | Ht 67.0 in | Wt 164.6 lb

## 2018-07-08 DIAGNOSIS — Z13220 Encounter for screening for lipoid disorders: Secondary | ICD-10-CM | POA: Diagnosis not present

## 2018-07-08 DIAGNOSIS — E890 Postprocedural hypothyroidism: Secondary | ICD-10-CM

## 2018-07-08 DIAGNOSIS — Z1329 Encounter for screening for other suspected endocrine disorder: Secondary | ICD-10-CM

## 2018-07-08 DIAGNOSIS — Z79899 Other long term (current) drug therapy: Secondary | ICD-10-CM

## 2018-07-08 DIAGNOSIS — E559 Vitamin D deficiency, unspecified: Secondary | ICD-10-CM

## 2018-07-08 DIAGNOSIS — J45909 Unspecified asthma, uncomplicated: Secondary | ICD-10-CM

## 2018-07-08 DIAGNOSIS — Z9889 Other specified postprocedural states: Secondary | ICD-10-CM

## 2018-07-08 DIAGNOSIS — F32A Depression, unspecified: Secondary | ICD-10-CM

## 2018-07-08 DIAGNOSIS — E8881 Metabolic syndrome: Secondary | ICD-10-CM

## 2018-07-08 DIAGNOSIS — Z1389 Encounter for screening for other disorder: Secondary | ICD-10-CM

## 2018-07-08 DIAGNOSIS — Z0000 Encounter for general adult medical examination without abnormal findings: Secondary | ICD-10-CM

## 2018-07-08 DIAGNOSIS — F329 Major depressive disorder, single episode, unspecified: Secondary | ICD-10-CM

## 2018-07-09 LAB — LIPID PANEL
Cholesterol: 206 mg/dL — ABNORMAL HIGH (ref <200)
HDL: 75 mg/dL (ref >50)
LDL Cholesterol (Calc): 114 mg/dL (calc) — ABNORMAL HIGH
NON-HDL CHOLESTEROL (CALC): 131 mg/dL — AB (ref <130)
Total CHOL/HDL Ratio: 2.7 (calc) (ref <5.0)
Triglycerides: 76 mg/dL (ref <150)

## 2018-07-09 LAB — CBC WITH DIFFERENTIAL/PLATELET
BASOS ABS: 50 {cells}/uL (ref 0–200)
Basophils Relative: 0.9 %
EOS ABS: 550 {cells}/uL — AB (ref 15–500)
Eosinophils Relative: 10 %
HCT: 37.2 % (ref 35.0–45.0)
Hemoglobin: 12.6 g/dL (ref 11.7–15.5)
Lymphs Abs: 2239 cells/uL (ref 850–3900)
MCH: 31.2 pg (ref 27.0–33.0)
MCHC: 33.9 g/dL (ref 32.0–36.0)
MCV: 92.1 fL (ref 80.0–100.0)
MONOS PCT: 7.8 %
MPV: 10.4 fL (ref 7.5–12.5)
Neutro Abs: 2233 cells/uL (ref 1500–7800)
Neutrophils Relative %: 40.6 %
PLATELETS: 286 10*3/uL (ref 140–400)
RBC: 4.04 10*6/uL (ref 3.80–5.10)
RDW: 12.6 % (ref 11.0–15.0)
TOTAL LYMPHOCYTE: 40.7 %
WBC mixed population: 429 cells/uL (ref 200–950)
WBC: 5.5 10*3/uL (ref 3.8–10.8)

## 2018-07-09 LAB — URINALYSIS, ROUTINE W REFLEX MICROSCOPIC
BILIRUBIN URINE: NEGATIVE
Glucose, UA: NEGATIVE
HGB URINE DIPSTICK: NEGATIVE
LEUKOCYTES UA: NEGATIVE
Nitrite: NEGATIVE
PROTEIN: NEGATIVE
Specific Gravity, Urine: 1.013 (ref 1.001–1.03)
pH: 7 (ref 5.0–8.0)

## 2018-07-09 LAB — COMPLETE METABOLIC PANEL WITH GFR
AG RATIO: 1.8 (calc) (ref 1.0–2.5)
ALT: 18 U/L (ref 6–29)
AST: 20 U/L (ref 10–30)
Albumin: 4.6 g/dL (ref 3.6–5.1)
Alkaline phosphatase (APISO): 52 U/L (ref 33–115)
BUN: 13 mg/dL (ref 7–25)
CALCIUM: 9.7 mg/dL (ref 8.6–10.2)
CHLORIDE: 102 mmol/L (ref 98–110)
CO2: 25 mmol/L (ref 20–32)
Creat: 0.65 mg/dL (ref 0.50–1.10)
GFR, EST AFRICAN AMERICAN: 131 mL/min/{1.73_m2} (ref >=60)
GFR, EST NON AFRICAN AMERICAN: 113 mL/min/{1.73_m2} (ref >=60)
Globulin: 2.6 g/dL (calc) (ref 1.9–3.7)
Glucose, Bld: 81 mg/dL (ref 65–99)
POTASSIUM: 4.3 mmol/L (ref 3.5–5.3)
SODIUM: 141 mmol/L (ref 135–146)
TOTAL PROTEIN: 7.2 g/dL (ref 6.1–8.1)
Total Bilirubin: 0.5 mg/dL (ref 0.2–1.2)

## 2018-07-09 LAB — MICROALBUMIN / CREATININE URINE RATIO
CREATININE, URINE: 57 mg/dL (ref 20–275)
Microalb Creat Ratio: 4 mcg/mg creat (ref <30)
Microalb, Ur: 0.2 mg/dL

## 2018-07-09 LAB — MAGNESIUM: Magnesium: 2 mg/dL (ref 1.5–2.5)

## 2018-07-09 LAB — TSH: TSH: 1.2 m[IU]/L

## 2018-07-09 LAB — VITAMIN D 25 HYDROXY (VIT D DEFICIENCY, FRACTURES): VIT D 25 HYDROXY: 49 ng/mL (ref 30–100)

## 2018-09-09 ENCOUNTER — Other Ambulatory Visit: Payer: Self-pay | Admitting: Physician Assistant

## 2018-09-09 DIAGNOSIS — J069 Acute upper respiratory infection, unspecified: Secondary | ICD-10-CM

## 2018-09-09 MED ORDER — CYCLOBENZAPRINE HCL 5 MG PO TABS: 10.0000 mg | ORAL_TABLET | Freq: Three times a day (TID) | ORAL | 0 refills | Status: DC | PRN

## 2018-12-21 DIAGNOSIS — J069 Acute upper respiratory infection, unspecified: Secondary | ICD-10-CM

## 2018-12-21 MED ORDER — AZITHROMYCIN 250 MG PO TABS: ORAL_TABLET | ORAL | 1 refills | Status: AC

## 2018-12-21 MED ORDER — PREDNISONE 20 MG PO TABS: ORAL_TABLET | ORAL | 0 refills | Status: DC

## 2018-12-21 MED ORDER — BENZONATATE 200 MG PO CAPS: 200.0000 mg | ORAL_CAPSULE | Freq: Three times a day (TID) | ORAL | 0 refills | Status: DC | PRN

## 2018-12-21 MED ORDER — ALBUTEROL SULFATE HFA 108 (90 BASE) MCG/ACT IN AERS: INHALATION_SPRAY | RESPIRATORY_TRACT | 3 refills | Status: DC

## 2018-12-21 NOTE — Addendum Note (Signed)
Addended by: Quentin Mulling R on: 12/21/2018 04:30 PM   Modules accepted: Orders

## 2018-12-21 NOTE — Telephone Encounter (Signed)
38 y.o. female calls with 7 days of URI symptoms. She is on Flonase, [employer].  Symptoms include sore throat, HA, sinus pressure, non productive cough and wheezing  Problem list has Asthma; Depression, major, recurrent, in partial remission (HCC); Insulin resistance; and S/P partial thyroidectomy on their problem list.  Medications Current Outpatient Medications on File Prior to Visit  Medication Sig  . albuterol (VENTOLIN HFA) 108 (90 Base) MCG/ACT inhaler Use 2 inhalations 15 minutes apart every 4 hours to rescue Asthma  . aspirin 81 MG chewable tablet Chew 81 mg by mouth daily.  Marland Kitchen azelastine (ASTELIN) 0.1 % nasal spray Place 2 sprays into both nostrils 2 (two) times daily. Use in each nostril as directed  . cyclobenzaprine (FLEXERIL) 5 MG tablet Take 2 tablets (10 mg total) by mouth every 8 (eight) hours as needed for muscle spasms.  . fluticasone (FLONASE) 50 MCG/ACT nasal spray Place 2 sprays into both nostrils daily.  . fluticasone furoate-vilanterol (BREO ELLIPTA) 100-25 MCG/INH AEPB Inhale 1 puff into the lungs daily. Rinse mouth with water after each use  . levonorgestrel (MIRENA) 20 MCG/24HR IUD 1 each by Intrauterine route once.  . montelukast (SINGULAIR) 10 MG tablet Take 1 tablet (10 mg total) by mouth daily.  Marland Kitchen neomycin-polymyxin-pramoxine (NEOSPORIN PLUS) 1 % cream Apply topically 2 (two) times daily.   No current facility-administered medications on file prior to visit.     No Known Allergies  I have prescribed I have prescribed Azithromyin 250 mg: two tablets now and then one tablet daily for 4 additonal days.  Make sure you are on an allergy pill such as claritin, allegra or zyrtec.  You may use an oral decongestant such as Mucinex D or if you have glaucoma or high blood pressure use plain Mucinex.  This is cough meds that you can take: A prescription cough medication called Tessalon Perles 100mg . You may take 1-2 capsules every 8 hours as needed for your cough.  If you  develop worsening sinus pain, fever or notice severe headache and vision changes, or if symptoms are not better after completion of antibiotic, please schedule an appointment with a health care provider. If you are getting worse please go to the ER.   Between 15-20 minutes of counseling, chart review, and critical decision making was performed

## 2019-02-12 DIAGNOSIS — Z3202 Encounter for pregnancy test, result negative: Secondary | ICD-10-CM | POA: Diagnosis not present

## 2019-02-12 DIAGNOSIS — Z01419 Encounter for gynecological examination (general) (routine) without abnormal findings: Secondary | ICD-10-CM | POA: Diagnosis not present

## 2019-02-12 DIAGNOSIS — Z113 Encounter for screening for infections with a predominantly sexual mode of transmission: Secondary | ICD-10-CM | POA: Diagnosis not present

## 2019-02-12 DIAGNOSIS — Z1389 Encounter for screening for other disorder: Secondary | ICD-10-CM | POA: Diagnosis not present

## 2019-02-12 DIAGNOSIS — Z6826 Body mass index (BMI) 26.0-26.9, adult: Secondary | ICD-10-CM | POA: Diagnosis not present

## 2019-02-12 DIAGNOSIS — Z30433 Encounter for removal and reinsertion of intrauterine contraceptive device: Secondary | ICD-10-CM | POA: Diagnosis not present

## 2019-03-04 ENCOUNTER — Ambulatory Visit (INDEPENDENT_AMBULATORY_CARE_PROVIDER_SITE_OTHER): Payer: BLUE CROSS/BLUE SHIELD | Admitting: Physician Assistant

## 2019-03-04 ENCOUNTER — Encounter: Payer: Self-pay | Admitting: Physician Assistant

## 2019-03-04 ENCOUNTER — Other Ambulatory Visit: Payer: Self-pay

## 2019-03-04 VITALS — BP 122/78 | HR 66 | Temp 98.1°F | Ht 67.0 in

## 2019-03-04 DIAGNOSIS — M545 Low back pain, unspecified: Secondary | ICD-10-CM

## 2019-03-04 MED ORDER — PREDNISONE 20 MG PO TABS: ORAL_TABLET | ORAL | 0 refills | Status: DC

## 2019-03-04 MED ORDER — MELOXICAM 15 MG PO TABS: ORAL_TABLET | ORAL | 1 refills | Status: DC

## 2019-03-04 NOTE — Progress Notes (Signed)
   Subjective:    Patient ID: Jennifer Ponce, female    DOB: 03/16/1981, 38 y.o.   MRN: [id number]  HPI 38 y.o. WF presents with back pain.  States she woke up on Monday with some right lower back pain, she went to bend over and she felt a horrible pain. This got better and worse however on Wednesday she was at work, bent to the right to pick up her laptop bag and had "searing" pain on her left lower back, drove home and had horrible pain left leg with every step. Pain with lateral hip, nothing past the knee. Worse with bending, twisting, walking.   She has taken advil which helped a little, took 400mg  last night and 400mg  this AM.  Tried 10 mg of flexeril that did not help.   Patient denies fever, hematuria, incontinence, numbness, tingling, weakness and saddle anesthesia   Blood pressure 122/78, pulse 66, temperature 98.1 F (36.7 C), height 5\' 7"  (1.702 m), last menstrual period 02/18/2019, SpO2 98 %.   Review of Systems See HPI    Objective:   Physical Exam Constitutional:      Appearance: She is well-developed.  HENT:     Head: Normocephalic and atraumatic.  Eyes:     Conjunctiva/sclera: Conjunctivae normal.     Pupils: Pupils are equal, round, and reactive to light.  Neck:     Musculoskeletal: Normal range of motion and neck supple.  Cardiovascular:     Rate and Rhythm: Normal rate and regular rhythm.  Pulmonary:     Effort: Pulmonary effort is normal.     Breath sounds: Normal breath sounds.  Abdominal:     General: Bowel sounds are normal.     Palpations: Abdomen is soft.     Tenderness: There is no abdominal tenderness.  Musculoskeletal:     Comments: Patient is able to ambulate well. Gait is  Antalgic. Straight leg raising with dorsiflexion positive bilaterally for radicular symptoms. Sensory exam in the legs are normal. Knee reflexes are normal Ankle reflexes are normal Strength is normal and symmetric in arms and legs. There is not SI tenderness to palpation.   There is notparaspinal muscle spasm.  There is not midline tenderness.  ROM of spine with  limited in all spheres due to pain.   Lymphadenopathy:     Cervical: No cervical adenopathy.  Skin:    General: Skin is warm and dry.     Findings: No rash.  Neurological:     Mental Status: She is alert and oriented to person, place, and time.     Deep Tendon Reflexes: Reflexes are normal and symmetric.        Assessment & Plan:  Jennifer Ponce was seen today for acute visit and back pain.  Diagnoses and all orders for this visit:  Acute left-sided low back pain without sciatica -     Ambulatory referral to Physical Therapy -     predniSONE (DELTASONE) 20 MG tablet; 2 tablets daily for 3 days, 1 tablet daily for 4 days. -     meloxicam (MOBIC) 15 MG tablet; Take one daily with food for 2 weeks, can take with tylenol, can not take with aleve, iburpofen, then as needed daily for pain  Lower back pain - + straight leg Prednisone was prescribed,NSAIDs, RICE, and exercise given If not better follow up in office or will refer to PT/orthopedics. Er precautions given

## 2019-03-04 NOTE — Patient Instructions (Addendum)
BACK PAIN  Try the exercises and other information in the back care manual.  Do prednisone first see info below You can take meloxicam once during the day as needed (avoid taking other NSAIDS like Alleve or Ibuprofen while taking this)   Go to the ER if you have any new weakness in your legs, have trouble controlling your urine or bowels, or have worsening pain.   If you are not better in 2 months month we will refer you to ortho  MAXIMUM AMOUNT OF TYLENOL IN A DAY  You can take tylenol (500mg ) or tylenol arthritis (650mg ) with the meloxicam/antiinflammatories. The max you can take of tylenol a day is 3000mg  daily, this is a max of 6 pills a day of the regular tyelnol (500mg ) or a max of 4 a day of the tylenol arthritis (650mg ) as long as no other medications you are taking contain tylenol.    Please take the prednisone to help decrease inflammation and therefore decrease symptoms. Take it it with food to avoid GI upset. It can cause increased energy but on the other hand it can make it hard to sleep at night so please take it AT NIGHT WITH DINNER, it takes 8-12 hours to start working so it will NOT affect your sleeping if you take it at night with your food!!  If you are diabetic it will increase your sugars so decrease carbs and monitor your sugars closely.      Back pain Rehab Ask your health care provider which exercises are safe for you. Do exercises exactly as told by your health care provider and adjust them as directed. It is normal to feel mild stretching, pulling, tightness, or discomfort as you do these exercises, but you should stop right away if you feel sudden pain or your pain gets worse. Do not begin these exercises until told by your health care provider. Stretching and range of motion exercises These exercises warm up your muscles and joints and improve the movement and flexibility of your hips and your back. These exercises also help to relieve pain, numbness, and tingling.  Exercise A: Sciatic nerve glide 1. Sit in a chair with your head facing down toward your chest. Place your hands behind your back. Let your shoulders slump forward. 2. Slowly straighten one of your knees while you tilt your head back as if you are looking toward the ceiling. Only straighten your leg as far as you can without making your symptoms worse. 3. Hold for __________ seconds. 4. Slowly return to the starting position. 5. Repeat with your other leg. Repeat __________ times. Complete this exercise __________ times a day. Exercise B: Knee to chest with hip adduction and internal rotation  1. Lie on your back on a firm surface with both legs straight. 2. Bend one of your knees and move it up toward your chest until you feel a gentle stretch in your lower back and buttock. Then, move your knee toward the shoulder that is on the opposite side from your leg. ? Hold your leg in this position by holding onto the front of your knee. 3. Hold for __________ seconds. 4. Slowly return to the starting position. 5. Repeat with your other leg. Repeat __________ times. Complete this exercise __________ times a day. Exercise C: Prone extension on elbows  1. Lie on your abdomen on a firm surface. A bed may be too soft for this exercise. 2. Prop yourself up on your elbows. 3. Use your arms to  help lift your chest up until you feel a gentle stretch in your abdomen and your lower back. ? This will place some of your body weight on your elbows. If this is uncomfortable, try stacking pillows under your chest. ? Your hips should stay down, against the surface that you are lying on. Keep your hip and back muscles relaxed. 4. Hold for __________ seconds. 5. Slowly relax your upper body and return to the starting position. Repeat __________ times. Complete this exercise __________ times a day. Strengthening exercises These exercises build strength and endurance in your back. Endurance is the ability to use  your muscles for a long time, even after they get tired. Exercise D: Pelvic tilt 1. Lie on your back on a firm surface. Bend your knees and keep your feet flat. 2. Tense your abdominal muscles. Tip your pelvis up toward the ceiling and flatten your lower back into the floor. ? To help with this exercise, you may place a small towel under your lower back and try to push your back into the towel. 3. Hold for __________ seconds. 4. Let your muscles relax completely before you repeat this exercise. Repeat __________ times. Complete this exercise __________ times a day. Exercise E: Alternating arm and leg raises  1. Get on your hands and knees on a firm surface. If you are on a hard floor, you may want to use padding to cushion your knees, such as an exercise mat. 2. Line up your arms and legs. Your hands should be below your shoulders, and your knees should be below your hips. 3. Lift your left leg behind you. At the same time, raise your right arm and straighten it in front of you. ? Do not lift your leg higher than your hip. ? Do not lift your arm higher than your shoulder. ? Keep your abdominal and back muscles tight. ? Keep your hips facing the ground. ? Do not arch your back. ? Keep your balance carefully, and do not hold your breath. 4. Hold for __________ seconds. 5. Slowly return to the starting position and repeat with your right leg and your left arm. Repeat __________ times. Complete this exercise __________ times a day. Posture and body mechanics  Body mechanics refers to the movements and positions of your body while you do your daily activities. Posture is part of body mechanics. Good posture and healthy body mechanics can help to relieve stress in your body's tissues and joints. Good posture means that your spine is in its natural S-curve position (your spine is neutral), your shoulders are pulled back slightly, and your head is not tipped forward. The following are general  guidelines for applying improved posture and body mechanics to your everyday activities. Standing   When standing, keep your spine neutral and your feet about hip-width apart. Keep a slight bend in your knees. Your ears, shoulders, and hips should line up.  When you do a task in which you stand in one place for a long time, place one foot up on a stable object that is 2-4 inches (5-10 cm) high, such as a footstool. This helps keep your spine neutral. Sitting   When sitting, keep your spine neutral and keep your feet flat on the floor. Use a footrest, if necessary, and keep your thighs parallel to the floor. Avoid rounding your shoulders, and avoid tilting your head forward.  When working at a desk or a computer, keep your desk at a height where your hands are  slightly lower than your elbows. Slide your chair under your desk so you are close enough to maintain good posture.  When working at a computer, place your monitor at a height where you are looking straight ahead and you do not have to tilt your head forward or downward to look at the screen. Resting   When lying down and resting, avoid positions that are most painful for you.  If you have pain with activities such as sitting, bending, stooping, or squatting (flexion-based activities), lie in a position in which your body does not bend very much. For example, avoid curling up on your side with your arms and knees near your chest (fetal position).  If you have pain with activities such as standing for a long time or reaching with your arms (extension-based activities), lie with your spine in a neutral position and bend your knees slightly. Try the following positions: ? Lying on your side with a pillow between your knees. ? Lying on your back with a pillow under your knees. Lifting   When lifting objects, keep your feet at least shoulder-width apart and tighten your abdominal muscles.  Bend your knees and hips and keep your spine  neutral. It is important to lift using the strength of your legs, not your back. Do not lock your knees straight out.  Always ask for help to lift heavy or awkward objects. This information is not intended to replace advice given to you by your health care provider. Make sure you discuss any questions you have with your health care provider. Document Released: 09/23/2005 Document Revised: 05/30/2016 Document Reviewed: 06/09/2015 Elsevier Interactive Patient Education  [employer].

## 2019-03-09 DIAGNOSIS — M545 Low back pain: Secondary | ICD-10-CM | POA: Diagnosis not present

## 2019-03-15 DIAGNOSIS — M545 Low back pain: Secondary | ICD-10-CM | POA: Diagnosis not present

## 2019-04-26 NOTE — Progress Notes (Deleted)
   Subjective:    Patient ID: Jennifer Ponce, female    DOB: Apr 15, 1981, 38 y.o.   MRN: [id number]  HPI 38 y.o. WF presents with depression symptoms.   She is on singulair  There were no vitals taken for this visit.  Medications Current Outpatient Medications on File Prior to Visit  Medication Sig  . albuterol (VENTOLIN HFA) 108 (90 Base) MCG/ACT inhaler Use 2 inhalations 15 minutes apart every 4 hours to rescue Asthma  . aspirin 81 MG chewable tablet Chew 81 mg by mouth daily.  Marland Kitchen azelastine (ASTELIN) 0.1 % nasal spray Place 2 sprays into both nostrils 2 (two) times daily. Use in each nostril as directed  . cyclobenzaprine (FLEXERIL) 5 MG tablet Take 2 tablets (10 mg total) by mouth every 8 (eight) hours as needed for muscle spasms.  . fluticasone (FLONASE) 50 MCG/ACT nasal spray Place 2 sprays into both nostrils daily.  Marland Kitchen levonorgestrel (MIRENA) 20 MCG/24HR IUD 1 each by Intrauterine route once.  . meloxicam (MOBIC) 15 MG tablet Take one daily with food for 2 weeks, can take with tylenol, can not take with aleve, iburpofen, then as needed daily for pain  . montelukast (SINGULAIR) 10 MG tablet Take 1 tablet (10 mg total) by mouth daily.  . predniSONE (DELTASONE) 20 MG tablet 2 tablets daily for 3 days, 1 tablet daily for 4 days.   No current facility-administered medications on file prior to visit.     Problem list She has Asthma; Depression, major, recurrent, in partial remission ([place]); Insulin resistance; and S/P partial thyroidectomy on their problem list.    Review of Systems     Objective:   Physical Exam        Assessment & Plan:

## 2019-04-28 ENCOUNTER — Ambulatory Visit: Payer: BLUE CROSS/BLUE SHIELD | Admitting: Physician Assistant

## 2019-05-31 NOTE — Progress Notes (Signed)
Subjective:    Patient ID: Jennifer Ponce, female    DOB: [birth year], 38 y.o.   MRN: [id number]  HPI 38 y.o. WF states she is not feeling well, having a lot of anxiety.  She is not sleeping, having night mares, sleeping 3-4 hours at night. No night terrors or waking up with issues. Nightmares are mainly about protecting her son or loved ones.  She is off singulair.  Melatonin made it worse.  Goes to bed around 10, will occ wake up randomly in the night to urinate or after a dream but could go back to sleep. She gets up at 6-630 and she will feel refreshed normally.  She is working from home 2-3 days a week, son is doing home school but doing well with it.  Had panic attacks when she was younger, age 38 did well with that.   Blood pressure 118/72, pulse 77, temperature 97.9 F (36.6 C), height 5\' 7"  (1.702 m), weight 170 lb (77.1 kg), last menstrual period 04/26/2019, SpO2 98 %.  Medications Current Outpatient Medications on File Prior to Visit  Medication Sig  . albuterol (VENTOLIN HFA) 108 (90 Base) MCG/ACT inhaler Use 2 inhalations 15 minutes apart every 4 hours to rescue Asthma  . aspirin 81 MG chewable tablet Chew 81 mg by mouth daily.  Marland Kitchen. azelastine (ASTELIN) 0.1 % nasal spray Place 2 sprays into both nostrils 2 (two) times daily. Use in each nostril as directed  . cyclobenzaprine (FLEXERIL) 5 MG tablet Take 2 tablets (10 mg total) by mouth every 8 (eight) hours as needed for muscle spasms.  . fluticasone (FLONASE) 50 MCG/ACT nasal spray Place 2 sprays into both nostrils daily.  Marland Kitchen. levonorgestrel (MIRENA) 20 MCG/24HR IUD 1 each by Intrauterine route once.  . meloxicam (MOBIC) 15 MG tablet Take one daily with food for 2 weeks, can take with tylenol, can not take with aleve, iburpofen, then as needed daily for pain  . montelukast (SINGULAIR) 10 MG tablet Take 1 tablet (10 mg total) by mouth daily.   No current facility-administered medications on file prior to visit.     Problem  list She has Asthma; Depression, major, recurrent, in partial remission ([facility]); Insulin resistance; and S/P partial thyroidectomy on their problem list.  Review of Systems  Constitutional: Negative.   HENT: Negative.   Respiratory: Negative.   Cardiovascular: Negative.   Gastrointestinal: Negative.   Genitourinary: Negative.   Musculoskeletal: Negative.   Skin: Negative.   Neurological: Negative.   Hematological: Negative.   Psychiatric/Behavioral: Positive for sleep disturbance. Negative for agitation, behavioral problems, confusion, decreased concentration, dysphoric mood, hallucinations, self-injury and suicidal ideas. The patient is nervous/anxious. The patient is not hyperactive.        Objective:   Physical Exam Constitutional:      Appearance: She is well-developed.  HENT:     Head: Normocephalic and atraumatic.     Right Ear: External ear normal.     Left Ear: External ear normal.  Eyes:     Conjunctiva/sclera: Conjunctivae normal.     Pupils: Pupils are equal, round, and reactive to light.  Neck:     Musculoskeletal: Normal range of motion and neck supple.     Thyroid: No thyromegaly.  Cardiovascular:     Rate and Rhythm: Normal rate and regular rhythm.     Heart sounds: Normal heart sounds. No murmur. No friction rub. No gallop.   Pulmonary:     Effort: Pulmonary effort is normal. No respiratory distress.  Breath sounds: Normal breath sounds. No wheezing.  Abdominal:     General: Bowel sounds are normal. There is no distension.     Palpations: Abdomen is soft. There is no mass.     Tenderness: There is no abdominal tenderness. There is no guarding or rebound.  Musculoskeletal: Normal range of motion.  Lymphadenopathy:     Cervical: No cervical adenopathy.  Skin:    General: Skin is warm and dry.  Neurological:     Mental Status: She is alert and oriented to person, place, and time.     Cranial Nerves: No cranial nerve deficit.     Coordination:  Coordination normal.     Deep Tendon Reflexes: Reflexes normal.        Assessment & Plan:   Depression, unspecified depression type -     CBC with Differential/Platelet -     COMPLETE METABOLIC PANEL WITH GFR -     TSH -     topiramate (TOPAMAX) 50 MG tablet; Take 1 tablet (50 mg total) by mouth at bedtime. Believe it is mainly stress related, will do topamax for anxiety and sleep If this does not help can try zoloft Increase exercise, eat well Good sleep hygiene discussed  Medication management -     CBC with Differential/Platelet -     COMPLETE METABOLIC PANEL WITH GFR -     TSH

## 2019-06-03 ENCOUNTER — Ambulatory Visit (INDEPENDENT_AMBULATORY_CARE_PROVIDER_SITE_OTHER): Payer: BC Managed Care – PPO | Admitting: Physician Assistant

## 2019-06-03 ENCOUNTER — Other Ambulatory Visit: Payer: Self-pay

## 2019-06-03 ENCOUNTER — Encounter: Payer: Self-pay | Admitting: Physician Assistant

## 2019-06-03 VITALS — BP 118/72 | HR 77 | Temp 97.9°F | Ht 67.0 in | Wt 170.0 lb

## 2019-06-03 DIAGNOSIS — F32A Depression, unspecified: Secondary | ICD-10-CM

## 2019-06-03 DIAGNOSIS — F329 Major depressive disorder, single episode, unspecified: Secondary | ICD-10-CM

## 2019-06-03 DIAGNOSIS — Z79899 Other long term (current) drug therapy: Secondary | ICD-10-CM

## 2019-06-03 MED ORDER — TOPIRAMATE 50 MG PO TABS: 50.0000 mg | ORAL_TABLET | Freq: Every day | ORAL | 2 refills | Status: DC

## 2019-06-03 NOTE — Patient Instructions (Signed)
TOPAMAX Going to start you on topamax, start on 1/2 pill for 3-5 nights, can increase to a whole pill for 1-2 weeks.  Can increase to 1 pill at night and 1/2-1 pill in the AM.  This medication is good for weight loss, headaches, pain This medication can cause numbness, tingling and can cause brain fog- stop if you get these If you get blurry vision or have a history of glaucoma please stop this medication.  Let me know how you are doing with this.   IF THIS DOES NOT HELP THAN WE WILL TRY ZOLOFT  Topiramate tablets What is this medicine? TOPIRAMATE (toe PYRE a mate) is used to treat seizures in adults or children with epilepsy. It is also used for the prevention of migraine headaches. This medicine may be used for other purposes; ask your health care provider or pharmacist if you have questions. COMMON BRAND NAME(S): Topamax, Topiragen What should I tell my health care provider before I take this medicine? They need to know if you have any of these conditions: -bleeding disorders -cirrhosis of the liver or liver disease -diarrhea -glaucoma -kidney stones or kidney disease -low blood counts, like low white cell, platelet, or red cell counts -lung disease like asthma, obstructive pulmonary disease, emphysema -metabolic acidosis -on a ketogenic diet -schedule for surgery or a procedure -suicidal thoughts, plans, or attempt; a previous suicide attempt by you or a family member -an unusual or allergic reaction to topiramate, other medicines, foods, dyes, or preservatives -pregnant or trying to get pregnant -breast-feeding How should I use this medicine? Take this medicine by mouth with a glass of water. Follow the directions on the prescription label. Do not crush or chew. You may take this medicine with meals. Take your medicine at regular intervals. Do not take it more often than directed. Talk to your pediatrician regarding the use of this medicine in children. Special care may be  needed. While this drug may be prescribed for children as young as 63 years of age for selected conditions, precautions do apply. Overdosage: If you think you have taken too much of this medicine contact a poison control center or emergency room at once. NOTE: This medicine is only for you. Do not share this medicine with others. What if I miss a dose? If you miss a dose, take it as soon as you can. If your next dose is to be taken in less than 6 hours, then do not take the missed dose. Take the next dose at your regular time. Do not take double or extra doses. What may interact with this medicine? Do not take this medicine with any of the following medications: -probenecid This medicine may also interact with the following medications: -acetazolamide -alcohol -amitriptyline -aspirin and aspirin-like medicines -birth control pills -certain medicines for depression -certain medicines for seizures -certain medicines that treat or prevent blood clots like warfarin, enoxaparin, dalteparin, apixaban, dabigatran, and rivaroxaban -digoxin -hydrochlorothiazide -lithium -medicines for pain, sleep, or muscle relaxation -metformin -methazolamide -NSAIDS, medicines for pain and inflammation, like ibuprofen or naproxen -pioglitazone -risperidone This list may not describe all possible interactions. Give your health care provider a list of all the medicines, herbs, non-prescription drugs, or dietary supplements you use. Also tell them if you smoke, drink alcohol, or use illegal drugs. Some items may interact with your medicine. What should I watch for while using this medicine? Visit your doctor or health care professional for regular checks on your progress. Do not stop taking this medicine  suddenly. This increases the risk of seizures if you are using this medicine to control epilepsy. Wear a medical identification bracelet or chain to say you have epilepsy or seizures, and carry a card that lists all  your medicines. This medicine can decrease sweating and increase your body temperature. Watch for signs of deceased sweating or fever, especially in children. Avoid extreme heat, hot baths, and saunas. Be careful about exercising, especially in hot weather. Contact your health care provider right away if you notice a fever or decrease in sweating. You should drink plenty of fluids while taking this medicine. If you have had kidney stones in the past, this will help to reduce your chances of forming kidney stones. If you have stomach pain, with nausea or vomiting and yellowing of your eyes or skin, call your doctor immediately. You may get drowsy, dizzy, or have blurred vision. Do not drive, use machinery, or do anything that needs mental alertness until you know how this medicine affects you. To reduce dizziness, do not sit or stand up quickly, especially if you are an older patient. Alcohol can increase drowsiness and dizziness. Avoid alcoholic drinks. If you notice blurred vision, eye pain, or other eye problems, seek medical attention at once for an eye exam. The use of this medicine may increase the chance of suicidal thoughts or actions. Pay special attention to how you are responding while on this medicine. Any worsening of mood, or thoughts of suicide or dying should be reported to your health care professional right away. This medicine may increase the chance of developing metabolic acidosis. If left untreated, this can cause kidney stones, bone disease, or slowed growth in children. Symptoms include breathing fast, fatigue, loss of appetite, irregular heartbeat, or loss of consciousness. Call your doctor immediately if you experience any of these side effects. Also, tell your doctor about any surgery you plan on having while taking this medicine since this may increase your risk for metabolic acidosis. Birth control pills may not work properly while you are taking this medicine. Talk to your doctor  about using an extra method of birth control. Women who become pregnant while using this medicine may enroll in the [place] American Antiepileptic Drug Pregnancy Registry by calling [phone]. This registry collects information about the safety of antiepileptic drug use during pregnancy. What side effects may I notice from receiving this medicine? Side effects that you should report to your doctor or health care professional as soon as possible: -allergic reactions like skin rash, itching or hives, swelling of the face, lips, or tongue -decreased sweating and/or rise in body temperature -depression -difficulty breathing, fast or irregular breathing patterns -difficulty speaking -difficulty walking or controlling muscle movements -hearing impairment -redness, blistering, peeling or loosening of the skin, including inside the mouth -tingling, pain or numbness in the hands or feet -unusual bleeding or bruising -unusually weak or tired -worsening of mood, thoughts or actions of suicide or dying Side effects that usually do not require medical attention (report to your doctor or health care professional if they continue or are bothersome): -altered taste -back pain, joint or muscle aches and pains -diarrhea, or constipation -headache -loss of appetite -nausea -stomach upset, indigestion -tremors This list may not describe all possible side effects. Call your doctor for medical advice about side effects. You may report side effects to FDA at 1-800-FDA-1088. Where should I keep my medicine? Keep out of the reach of children. Store at room temperature between 15 and 30 degrees C (59  and 86 degrees F) in a tightly closed container. Protect from moisture. Throw away any unused medicine after the expiration date. NOTE: This sheet is a summary. It may not cover all possible information. If you have questions about this medicine, talk to your doctor, pharmacist, or health care provider.  2018  Elsevier/Gold Standard (2013-09-27 23:17:57)

## 2019-06-04 LAB — CBC WITH DIFFERENTIAL/PLATELET
Absolute Monocytes: 498 cells/uL (ref 200–950)
Basophils Absolute: 38 cells/uL (ref 0–200)
Basophils Relative: 0.6 %
Eosinophils Absolute: 258 cells/uL (ref 15–500)
Eosinophils Relative: 4.1 %
HCT: 37.9 % (ref 35.0–45.0)
Hemoglobin: 12.7 g/dL (ref 11.7–15.5)
Lymphs Abs: 1928 cells/uL (ref 850–3900)
MCH: 32 pg (ref 27.0–33.0)
MCHC: 33.5 g/dL (ref 32.0–36.0)
MCV: 95.5 fL (ref 80.0–100.0)
MPV: 10.8 fL (ref 7.5–12.5)
Monocytes Relative: 7.9 %
Neutro Abs: 3578 cells/uL (ref 1500–7800)
Neutrophils Relative %: 56.8 %
Platelets: 253 10*3/uL (ref 140–400)
RBC: 3.97 10*6/uL (ref 3.80–5.10)
RDW: 12.7 % (ref 11.0–15.0)
Total Lymphocyte: 30.6 %
WBC: 6.3 10*3/uL (ref 3.8–10.8)

## 2019-06-04 LAB — COMPLETE METABOLIC PANEL WITH GFR
AG Ratio: 1.8 (calc) (ref 1.0–2.5)
ALT: 11 U/L (ref 6–29)
AST: 16 U/L (ref 10–30)
Albumin: 4.4 g/dL (ref 3.6–5.1)
Alkaline phosphatase (APISO): 48 U/L (ref 31–125)
BUN: 13 mg/dL (ref 7–25)
CO2: 26 mmol/L (ref 20–32)
Calcium: 9.3 mg/dL (ref 8.6–10.2)
Chloride: 104 mmol/L (ref 98–110)
Creat: 0.64 mg/dL (ref 0.50–1.10)
GFR, Est African American: 130 mL/min/{1.73_m2} (ref >=60)
GFR, Est Non African American: 112 mL/min/{1.73_m2} (ref >=60)
Globulin: 2.5 g/dL (calc) (ref 1.9–3.7)
Glucose, Bld: 84 mg/dL (ref 65–99)
Potassium: 4 mmol/L (ref 3.5–5.3)
Sodium: 137 mmol/L (ref 135–146)
Total Bilirubin: 0.6 mg/dL (ref 0.2–1.2)
Total Protein: 6.9 g/dL (ref 6.1–8.1)

## 2019-06-04 LAB — TSH: TSH: 0.78 mIU/L

## 2019-07-07 NOTE — Progress Notes (Signed)
Complete Physical  Assessment and Plan:  Depression, unspecified depression type CONTINUE TOPAMAX  Insulin resistance` Monitor weight  History of thyroid nodule -     TSH  S/P partial thyroidectomy Monitor TSH  Asthma, chronic, unspecified asthma severity, uncomplicated Avoid triggers, continue meds  Screening cholesterol level -     Lipid panel  Medication management -     CBC with Differential/Platelet -     BASIC METABOLIC PANEL WITH GFR -     Hepatic function panel -     Magnesium  Vitamin D deficiency -     VITAMIN D 25 Hydroxy (Vit-D Deficiency, Fractures)  Routine general medical examination at a health care facility 1 year  Screening for blood or protein in urine -     Urinalysis, Routine w reflex microscopic -     Microalbumin / creatinine urine ratio  Needs flu shot -     FLU VACCINE MDCK QUAD W/Preservative  Right foot pain -     Ambulatory referral to Podiatry - given stretches/exercises to do, mobic, and ICE - likely capsulitits    Future Appointments  Date Time Provider Department Center  07/11/2020 10:00 AM Quentin Mulling, PA-C [facility] None    HPI Patient presents for complete physical.   Vitamin D def on medications.   States she fell down 4 steps in Labor day weekend, hurt right foot. No immediate swelling or bruising with initial fall, pain at top/lateral foot. No numbness/tingling. Started to work out again, walking at the park and she is having off and on pain.   Started on topamax for sleep/dreams and she states she is doing well other than some abnormal taste with it.  Had benign thyroid nodule removal in 2016 due to compressive symptoms and states she is doing well.  Lab Results  Component Value Date   TSH 0.78 06/03/2019   Has seen Dr. Maple Hudson, had PFTs 09/2013, and thought to have mild asthma, on albuterol, has had some flares with allergies.  She has had a normal cholesterol,  Lab Results  Component Value Date   CHOL 206  (H) 07/08/2018   HDL 75 07/08/2018   LDLCALC 114 (H) 07/08/2018   TRIG 76 07/08/2018   CHOLHDL 2.7 07/08/2018  In 2014 her A1C was normal at 5.3 but she had an elevated insulin at 30.  Wt Readings from Last 3 Encounters:  07/12/19 170 lb 3.2 oz (77.2 kg)  06/03/19 170 lb (77.1 kg)  07/08/18 164 lb 9.6 oz (74.7 kg)  She is married with 1 son, Francesco Runner, 57 years old  Current Medications:  Current Outpatient Medications on File Prior to Visit  Medication Sig  . albuterol (VENTOLIN HFA) 108 (90 Base) MCG/ACT inhaler Use 2 inhalations 15 minutes apart every 4 hours to rescue Asthma  . aspirin 81 MG chewable tablet Chew 81 mg by mouth daily.  Marland Kitchen azelastine (ASTELIN) 0.1 % nasal spray Place 2 sprays into both nostrils 2 (two) times daily. Use in each nostril as directed  . cyclobenzaprine (FLEXERIL) 5 MG tablet Take 2 tablets (10 mg total) by mouth every 8 (eight) hours as needed for muscle spasms.  . fluticasone (FLONASE) 50 MCG/ACT nasal spray Place 2 sprays into both nostrils daily.  Marland Kitchen levonorgestrel (MIRENA) 20 MCG/24HR IUD 1 each by Intrauterine route once.  . meloxicam (MOBIC) 15 MG tablet Take one daily with food for 2 weeks, can take with tylenol, can not take with aleve, iburpofen, then as needed daily for pain  .  topiramate (TOPAMAX) 50 MG tablet Take 1 tablet (50 mg total) by mouth at bedtime.   No current facility-administered medications on file prior to visit.    Health Maintainance:  Immunization History  Administered Date(s) Administered  . Influenza Inj Mdck Quad With Preservative 07/12/2019  . Influenza-Unspecified 10/07/2017  . Td 08/13/2005  . Tdap 02/22/2015   Tetatus 2016 Pap: 02/2018, Dr. Melba Coon Sexually active: yes, STD testing offered but declines.  LMP: on mirena placed 2020 MGM N/A Colonoscopy N/A Eye Doctor Dr. Sabra Heck Dec 2014, wears glasses, 2 years  Allergies:  No Known Allergies Medical History:  Past Medical History:  Diagnosis Date  . Allergy   .  Anxiety   . Bronchitis    hx of  . Depression   . Heart murmur    born with a heart murmur; pt stated "I havent seen a doctor for it since I was a kid"  . History of thyroid nodule 06/23/2015  . Pneumonia    hx of  . Thyroid nodule    SURGICAL HISTORY She  has a past surgical history that includes Thyroid lobectomy (Left, 05/11/2015) and Thyroidectomy (N/A, 05/11/2015). FAMILY HISTORY Her family history includes Brain cancer in her maternal grandfather; Breast cancer in her maternal aunt; Heart disease in her maternal grandmother. SOCIAL HISTORY She  reports that she quit smoking about 11 years ago. Her smoking use included cigarettes. She has a 5.50 pack-year smoking history. She has never used smokeless tobacco. She reports current alcohol use. She reports that she does not use drugs.   Surgical History:  Review of Systems  Constitutional: Negative.  Negative for chills and fever.  HENT: Negative for congestion, ear discharge, ear pain, hearing loss, nosebleeds, sore throat and tinnitus.   Respiratory: Negative for cough, hemoptysis, sputum production, shortness of breath, wheezing and stridor.   Cardiovascular: Negative.   Gastrointestinal: Negative.   Genitourinary: Negative.   Musculoskeletal: Positive for myalgias.  Skin: Negative.  Negative for rash.  Neurological: Negative.  Negative for headaches.  Psychiatric/Behavioral: Negative.     Physical Exam: Estimated body mass index is 26.66 kg/m as calculated from the following:   Height as of this encounter: 5\' 7"  (1.702 m).   Weight as of this encounter: 170 lb 3.2 oz (77.2 kg). Vitals:   07/12/19 1012  BP: 124/76  Pulse: (!) 59  Temp: 97.7 F (36.5 C)  SpO2: 99%   General Appearance: Well nourished, in no apparent distress. Eyes: PERRLA, EOMs, conjunctiva no swelling or erythema, normal fundi and vessels. Sinuses: No Frontal/maxillary tenderness ENT/Mouth: Ext aud canals clear, normal light reflex with TMs without  erythema, bulging.  Good dentition. No erythema, swelling, or exudate on post pharynx. Tonsils not swollen or erythematous. Hearing normal.  Neck: Supple, well healing horizontal thyroid scar. No bruits Respiratory: Respiratory effort normal, BS equal bilaterally without rales, rhonci, wheezing or stridor. Cardio: Heart sounds normal, regular rate and rhythm without murmurs, rubs or gallops. Peripheral pulses brisk and equal bilaterally, without edema.  Chest: symmetric, with normal excursions and percussion. Breasts: defer OB/GYN Abdomen: Flat, soft, with bowl sounds, no tenderness, no guarding, rebound, hernias, masses, or organomegaly. .  Lymphatics: Non tender without lymphadenopathy Genitourinary: defer Musculoskeletal: Full ROM all peripheral extremities,5/5 strength, and normal gait. Skin: Warm, dry without rashes, lesions, ecchymosis.  Neuro: Cranial nerves intact, reflexes equal bilaterally. Normal muscle tone, no cerebellar symptoms. Sensation intact.  Pysch: Awake and oriented X 3, normal affect, Insight and Judgment appropriate.   EKG:  defer  Quentin MullingAmanda Luby Seamans 10:27 AM

## 2019-07-12 ENCOUNTER — Ambulatory Visit (INDEPENDENT_AMBULATORY_CARE_PROVIDER_SITE_OTHER): Payer: BC Managed Care – PPO | Admitting: Physician Assistant

## 2019-07-12 ENCOUNTER — Other Ambulatory Visit: Payer: Self-pay

## 2019-07-12 ENCOUNTER — Encounter: Payer: Self-pay | Admitting: Physician Assistant

## 2019-07-12 VITALS — BP 124/76 | HR 59 | Temp 97.7°F | Ht 67.0 in | Wt 170.2 lb

## 2019-07-12 DIAGNOSIS — Z0000 Encounter for general adult medical examination without abnormal findings: Secondary | ICD-10-CM

## 2019-07-12 DIAGNOSIS — E559 Vitamin D deficiency, unspecified: Secondary | ICD-10-CM

## 2019-07-12 DIAGNOSIS — Z13220 Encounter for screening for lipoid disorders: Secondary | ICD-10-CM

## 2019-07-12 DIAGNOSIS — Z79899 Other long term (current) drug therapy: Secondary | ICD-10-CM | POA: Diagnosis not present

## 2019-07-12 DIAGNOSIS — Z0001 Encounter for general adult medical examination with abnormal findings: Secondary | ICD-10-CM

## 2019-07-12 DIAGNOSIS — E890 Postprocedural hypothyroidism: Secondary | ICD-10-CM

## 2019-07-12 DIAGNOSIS — J45909 Unspecified asthma, uncomplicated: Secondary | ICD-10-CM

## 2019-07-12 DIAGNOSIS — Z131 Encounter for screening for diabetes mellitus: Secondary | ICD-10-CM

## 2019-07-12 DIAGNOSIS — Z130 Encounter for screening for diseases of the blood and blood-forming organs and certain disorders involving the immune mechanism: Secondary | ICD-10-CM

## 2019-07-12 DIAGNOSIS — Z9889 Other specified postprocedural states: Secondary | ICD-10-CM

## 2019-07-12 DIAGNOSIS — Z1389 Encounter for screening for other disorder: Secondary | ICD-10-CM

## 2019-07-12 DIAGNOSIS — M79671 Pain in right foot: Secondary | ICD-10-CM

## 2019-07-12 DIAGNOSIS — E88819 Insulin resistance, unspecified: Secondary | ICD-10-CM

## 2019-07-12 DIAGNOSIS — E8881 Metabolic syndrome: Secondary | ICD-10-CM

## 2019-07-12 DIAGNOSIS — F3341 Major depressive disorder, recurrent, in partial remission: Secondary | ICD-10-CM

## 2019-07-12 DIAGNOSIS — Z23 Encounter for immunization: Secondary | ICD-10-CM | POA: Diagnosis not present

## 2019-07-12 NOTE — Patient Instructions (Signed)
Mobic is an antiinflammatory- take x 2 weeks  It helps pain, can not take with aleve, or ibuprofen You can take tylenol (500mg ) or tylenol arthritis (650mg ) with the meloxicam/antiinflammatories. The max you can take of tylenol a day is 3000mg  daily, this is a max of 6 pills a day of the regular tyelnol (500mg ) or a max of 4 a day of the tylenol arthritis (650mg ) as long as no other medications you are taking contain tylenol.   Mobic can cause inflammation in your stomach and can cause ulcers or bleeding, this will look like black tarry stools Make sure you take your mobic with food Try not to take it daily, take AS needed Can take with pepcid  Do water bottle frozen and roll your foot on it Do stretches at night     What are the causes? This condition may be caused by:  Standing for long periods of time.  Wearing shoes that do not fit.  Doing high-impact activities, including running, aerobics, and ballet.  Being overweight.  Having an abnormal way of walking (gait).  Having tight calf muscles.  Having high arches in your feet.  Starting a new athletic activity.  What are the signs or symptoms? The main symptom of this condition is heel pain. Other symptoms include:  Pain that gets worse after activity or exercise.  Pain that is worse in the morning or after resting.  Pain that goes away after you walk for a few minutes.  How is this diagnosed? This condition may be diagnosed based on your signs and symptoms. Your health care provider will also do a physical exam to check for:  You may also need to have imaging studies to confirm the diagnosis. These can include:  X-rays.  Ultrasound.  MRI.  Follow these instructions at home:  Take medicines only as directed by your health care provider.  Avoid activities that cause pain.  Roll the bottom of your foot over a bag of ice or a bottle of cold water. Do this for 20 minutes, 3-4 times a day.  Perform simple  stretches as directed by your health care provider.  Try wearing athletic shoes with air-sole or gel-sole cushions or soft shoe inserts.  Keep all follow-up appointments with your health care provider.

## 2019-07-15 LAB — VITAMIN B12: Vitamin B-12: 485 pg/mL (ref 200–1100)

## 2019-07-15 LAB — LIPID PANEL
Cholesterol: 163 mg/dL (ref <200)
HDL: 73 mg/dL (ref >=50)
LDL Cholesterol (Calc): 76 mg/dL (calc)
Non-HDL Cholesterol (Calc): 90 mg/dL (calc) (ref <130)
Total CHOL/HDL Ratio: 2.2 (calc) (ref <5.0)
Triglycerides: 68 mg/dL (ref <150)

## 2019-07-15 LAB — HEMOGLOBIN A1C
Hgb A1c MFr Bld: 4.8 % of total Hgb (ref <5.7)
Mean Plasma Glucose: 91 (calc)
eAG (mmol/L): 5 (calc)

## 2019-07-15 LAB — IRON, TOTAL/TOTAL IRON BINDING CAP
%SAT: 77 % (calc) — ABNORMAL HIGH (ref 16–45)
Iron: 217 ug/dL — ABNORMAL HIGH (ref 40–190)
TIBC: 283 mcg/dL (calc) (ref 250–450)

## 2019-07-15 LAB — VITAMIN D 25 HYDROXY (VIT D DEFICIENCY, FRACTURES): Vit D, 25-Hydroxy: 38 ng/mL (ref 30–100)

## 2019-07-15 LAB — TEST AUTHORIZATION

## 2019-07-15 LAB — MAGNESIUM: Magnesium: 2 mg/dL (ref 1.5–2.5)

## 2019-07-15 LAB — FERRITIN: Ferritin: 149 ng/mL (ref 16–154)

## 2019-09-28 ENCOUNTER — Other Ambulatory Visit: Payer: Self-pay | Admitting: Physician Assistant

## 2020-01-11 ENCOUNTER — Ambulatory Visit: Payer: BC Managed Care – PPO | Admitting: Physician Assistant

## 2020-01-11 ENCOUNTER — Ambulatory Visit: Payer: BC Managed Care – PPO | Admitting: Adult Health Nurse Practitioner

## 2020-06-05 DIAGNOSIS — Z20822 Contact with and (suspected) exposure to covid-19: Secondary | ICD-10-CM | POA: Diagnosis not present

## 2020-07-10 NOTE — Progress Notes (Deleted)
Complete Physical  Assessment and Plan:  Depression, unspecified depression type CONTINUE TOPAMAX  Insulin resistance` Monitor weight  History of thyroid nodule -     TSH  S/P partial thyroidectomy Monitor TSH  Asthma, chronic, unspecified asthma severity, uncomplicated Avoid triggers, continue meds  Screening cholesterol level -     Lipid panel  Medication management -     CBC with Differential/Platelet -     BASIC METABOLIC PANEL WITH GFR -     Hepatic function panel -     Magnesium  Vitamin D deficiency -     VITAMIN D 25 Hydroxy (Vit-D Deficiency, Fractures)  Routine general medical examination at a health care facility 1 year  Screening for blood or protein in urine -     Urinalysis, Routine w reflex microscopic -     Microalbumin / creatinine urine ratio  Needs flu shot -     FLU VACCINE MDCK QUAD W/Preservative  Right foot pain -     Ambulatory referral to Podiatry - given stretches/exercises to do, mobic, and ICE - likely capsulitits    Future Appointments  Date Time Provider Department Center  07/11/2020 10:00 AM Jennifer Mulling, PA-C [facility] None  07/12/2021 10:00 AM Jennifer Gaudier, NP [facility] None    HPI Patient presents for complete physical.   Vitamin D def on medications.   States she fell down 4 steps in Labor day weekend, hurt right foot. No immediate swelling or bruising with initial fall, pain at top/lateral foot. No numbness/tingling. Started to work out again, walking at the park and she is having off and on pain.   Started on topamax for sleep/dreams and she states she is doing well other than some abnormal taste with it.  Had benign thyroid nodule removal in 2016 due to compressive symptoms and states she is doing well.  Lab Results  Component Value Date   TSH 0.78 06/03/2019   Has seen Dr. Maple Hudson, had PFTs 09/2013, and thought to have mild asthma, on albuterol, has had some flares with allergies.  She has had a normal  cholesterol,  Lab Results  Component Value Date   CHOL 163 07/12/2019   HDL 73 07/12/2019   LDLCALC 76 07/12/2019   TRIG 68 07/12/2019   CHOLHDL 2.2 07/12/2019  In 2014 her A1C was normal at 5.3 but she had an elevated insulin at 30.  Wt Readings from Last 3 Encounters:  07/12/19 170 lb 3.2 oz (77.2 kg)  06/03/19 170 lb (77.1 kg)  07/08/18 164 lb 9.6 oz (74.7 kg)  She is married with 1 son, Jennifer Ponce, 4 years old  Current Medications:  Current Outpatient Medications on File Prior to Visit  Medication Sig  . albuterol (VENTOLIN HFA) 108 (90 Base) MCG/ACT inhaler Use 2 inhalations 15 minutes apart every 4 hours to rescue Asthma  . aspirin 81 MG chewable tablet Chew 81 mg by mouth daily.  Marland Kitchen azelastine (ASTELIN) 0.1 % nasal spray Place 2 sprays into both nostrils 2 (two) times daily. Use in each nostril as directed  . cyclobenzaprine (FLEXERIL) 5 MG tablet Take 2 tablets (10 mg total) by mouth every 8 (eight) hours as needed for muscle spasms.  . fluticasone (FLONASE) 50 MCG/ACT nasal spray Place 2 sprays into both nostrils daily.  Marland Kitchen levonorgestrel (MIRENA) 20 MCG/24HR IUD 1 each by Intrauterine route once.  . topiramate (TOPAMAX) 50 MG tablet Take 1 tablet at Bedtime for  Migraine Prevention   No current facility-administered medications on file prior to  visit.   Health Maintainance:  Immunization History  Administered Date(s) Administered  . Influenza Inj Mdck Quad With Preservative 07/12/2019  . Influenza-Unspecified 10/07/2017  . Td 08/13/2005  . Tdap 02/22/2015   Tetatus 2016 Pap: 02/2018, Dr. Ellyn Hack Sexually active: yes, STD testing offered but declines.  LMP: on mirena placed 2020 MGM N/A Colonoscopy N/A Eye Doctor Dr. Hyacinth Meeker Dec 2014, wears glasses, 2 years  Allergies:  No Known Allergies Medical History:  Past Medical History:  Diagnosis Date  . Allergy   . Anxiety   . Bronchitis    hx of  . Depression   . Heart murmur    born with a heart murmur; pt stated  "I havent seen a doctor for it since I was a kid"  . History of thyroid nodule 06/23/2015  . Pneumonia    hx of  . Thyroid nodule    SURGICAL HISTORY She  has a past surgical history that includes Thyroid lobectomy (Left, 05/11/2015) and Thyroidectomy (N/A, 05/11/2015). FAMILY HISTORY Her family history includes Brain cancer in her maternal grandfather; Breast cancer in her maternal aunt; Heart disease in her maternal grandmother. SOCIAL HISTORY She  reports that she quit smoking about 12 years ago. Her smoking use included cigarettes. She has a 5.50 pack-year smoking history. She has never used smokeless tobacco. She reports current alcohol use. She reports that she does not use drugs.   Surgical History:  Review of Systems  Constitutional: Negative.  Negative for chills and fever.  HENT: Negative for congestion, ear discharge, ear pain, hearing loss, nosebleeds, sore throat and tinnitus.   Respiratory: Negative for cough, hemoptysis, sputum production, shortness of breath, wheezing and stridor.   Cardiovascular: Negative.   Gastrointestinal: Negative.   Genitourinary: Negative.   Musculoskeletal: Positive for myalgias.  Skin: Negative.  Negative for rash.  Neurological: Negative.  Negative for headaches.  Psychiatric/Behavioral: Negative.     Physical Exam: Estimated body mass index is 26.66 kg/m as calculated from the following:   Height as of 07/12/19: 5\' 7"  (1.702 m).   Weight as of 07/12/19: 170 lb 3.2 oz (77.2 kg). There were no vitals filed for this visit. General Appearance: Well nourished, in no apparent distress. Eyes: PERRLA, EOMs, conjunctiva no swelling or erythema, normal fundi and vessels. Sinuses: No Frontal/maxillary tenderness ENT/Mouth: Ext aud canals clear, normal light reflex with TMs without erythema, bulging.  Good dentition. No erythema, swelling, or exudate on post pharynx. Tonsils not swollen or erythematous. Hearing normal.  Neck: Supple, well healing  horizontal thyroid scar. No bruits Respiratory: Respiratory effort normal, BS equal bilaterally without rales, rhonci, wheezing or stridor. Cardio: Heart sounds normal, regular rate and rhythm without murmurs, rubs or gallops. Peripheral pulses brisk and equal bilaterally, without edema.  Chest: symmetric, with normal excursions and percussion. Breasts: defer OB/GYN Abdomen: Flat, soft, with bowl sounds, no tenderness, no guarding, rebound, hernias, masses, or organomegaly. .  Lymphatics: Non tender without lymphadenopathy Genitourinary: defer Musculoskeletal: Full ROM all peripheral extremities,5/5 strength, and normal gait. Skin: Warm, dry without rashes, lesions, ecchymosis.  Neuro: Cranial nerves intact, reflexes equal bilaterally. Normal muscle tone, no cerebellar symptoms. Sensation intact.  Pysch: Awake and oriented X 3, normal affect, Insight and Judgment appropriate.   EKG:  defer   09/11/19 8:42 AM

## 2020-07-11 ENCOUNTER — Encounter: Payer: BC Managed Care – PPO | Admitting: Physician Assistant

## 2020-07-24 ENCOUNTER — Ambulatory Visit (INDEPENDENT_AMBULATORY_CARE_PROVIDER_SITE_OTHER): Payer: BC Managed Care – PPO | Admitting: Adult Health Nurse Practitioner

## 2020-07-24 ENCOUNTER — Encounter: Payer: Self-pay | Admitting: Adult Health Nurse Practitioner

## 2020-07-24 ENCOUNTER — Other Ambulatory Visit: Payer: Self-pay

## 2020-07-24 VITALS — BP 122/86 | HR 62 | Temp 97.7°F | Wt 168.0 lb

## 2020-07-24 DIAGNOSIS — R059 Cough, unspecified: Secondary | ICD-10-CM

## 2020-07-24 DIAGNOSIS — J4541 Moderate persistent asthma with (acute) exacerbation: Secondary | ICD-10-CM | POA: Diagnosis not present

## 2020-07-24 DIAGNOSIS — J069 Acute upper respiratory infection, unspecified: Secondary | ICD-10-CM

## 2020-07-24 DIAGNOSIS — Z7189 Other specified counseling: Secondary | ICD-10-CM

## 2020-07-24 DIAGNOSIS — Z1152 Encounter for screening for COVID-19: Secondary | ICD-10-CM

## 2020-07-24 DIAGNOSIS — R062 Wheezing: Secondary | ICD-10-CM

## 2020-07-24 DIAGNOSIS — J029 Acute pharyngitis, unspecified: Secondary | ICD-10-CM

## 2020-07-24 LAB — POC COVID19 BINAXNOW: SARS Coronavirus 2 Ag: NEGATIVE

## 2020-07-24 MED ORDER — ALBUTEROL SULFATE HFA 108 (90 BASE) MCG/ACT IN AERS: INHALATION_SPRAY | RESPIRATORY_TRACT | 2 refills | Status: DC

## 2020-07-24 MED ORDER — DOXYCYCLINE HYCLATE 100 MG PO TABS: 100.0000 mg | ORAL_TABLET | Freq: Two times a day (BID) | ORAL | 0 refills | Status: AC

## 2020-07-24 MED ORDER — PREDNISONE 10 MG (21) PO TBPK: ORAL_TABLET | Freq: Every day | ORAL | 0 refills | Status: DC

## 2020-07-24 MED ORDER — FLUTICASONE PROPIONATE 50 MCG/ACT NA SUSP: 2.0000 | Freq: Every day | NASAL | 1 refills | Status: DC

## 2020-07-24 MED ORDER — MONTELUKAST SODIUM 10 MG PO TABS: 10.0000 mg | ORAL_TABLET | Freq: Every day | ORAL | 2 refills | Status: DC

## 2020-07-24 MED ORDER — DEXAMETHASONE SODIUM PHOSPHATE 10 MG/ML IJ SOLN
10.0000 mg | Freq: Once | INTRAMUSCULAR | Status: AC
  Administered 2020-07-24: 10 mg via INTRAMUSCULAR

## 2020-07-24 MED ORDER — IPRATROPIUM-ALBUTEROL 0.5-2.5 (3) MG/3ML IN SOLN
3.0000 mL | Freq: Once | RESPIRATORY_TRACT | Status: AC
  Administered 2020-07-24: 3 mL via RESPIRATORY_TRACT

## 2020-07-24 NOTE — Patient Instructions (Addendum)
Today in office you received a breathing treatment and also dexamethasone injection to help reduce inflammation of your airways.   Sent in prednisone taper pack and doxycycline antibiotic.   We are going to send in montelukast (Singular) for you to take daily to help reduce asthma exacerbation.  You may continue to take cetirizine (zyrtec) nighty.  You might need to rotate through the three choices below to find the most drying.  Allergy Symptoms / Runny Nose: Chose one  Zyrtec / Cetirizine     Can use "D" if you have nasal congestion. Take 10mg  by mouth May cause drowsiness, take nightly Be sure to drink plenty of water If this is not effective, try Xyzal or Allegra  OR  Xyzal / Levocetirazine  Take 5mg  by mouth May cause drowsiness, take nightly Be sure to drink plenty of water If this is not effective try Allegra or Zyrtec  OR  Claritin / loratadine One tablet daily    You can also try:      This is in a different class than those above.  Allegra / fexofenadine Take 180mg  by mouth daily If this is not effective try Zyrtec or Xyzal    *If you battle with chronic allergies you may need to change the antihistamine you currently use to find most effective.   -[facility] Sinus Rinse / Neti Pot Use warm bottled or distilled water DO NOT use tap water! Use twice a day as needed This will help to sooth irritated sinuses and clear nasal congestion If using nasal sprays, do so after completing this.  Flonase:  One spray in each nostril daily  This will help to open your nasal passages.  Saline Nasal Spray: to sooth your nose You can get this at any pharmacy Use as directed on package This will help to sooth inside of your nose from irritation.

## 2020-07-24 NOTE — Progress Notes (Signed)
Assessment and Plan:  Jennifer Ponce was seen today for acute visit, cough, wheezing and sore throat.  Diagnoses and all orders for this visit:  Encounter for screening for COVID-19 Educated about COVI-19 infection -     POC COVID-19 BinaxNow - Neg- 3 days s/p onset of symptoms Discussed getting tested again if symptoms persist/not improving after treatment.  Moderate persistent asthma with exacerbation -     doxycycline (VIBRA-TABS) 100 MG tablet; Take 1 tablet (100 mg total) by mouth 2 (two) times daily for 7 days. -     predniSONE (STERAPRED UNI-PAK 21 TAB) 10 MG (21) TBPK tablet; Take by mouth daily. Follow directions on Taper pack -     montelukast (SINGULAIR) 10 MG tablet; Take 1 tablet (10 mg total) by mouth daily.  Wheezing -     ipratropium-albuterol (DUONEB) 0.5-2.5 (3) MG/3ML nebulizer solution 3 mL received in office today -     dexamethasone (DECADRON) injection 10 mg Improvement in wheezing but not resolved  Cough Sore throat Acute URI -     fluticasone (FLONASE) 50 MCG/ACT nasal spray; Place 2 sprays into both nostrils daily. -     albuterol (VENTOLIN HFA) 108 (90 Base) MCG/ACT inhaler; Use 2 inhalations 15 minutes apart every 4 hours to rescue Asthma    Contact office or return with any new or worsening symptoms.  Discussed hospital precautions with patient including but not limited to change in vision, weakness one side of body, chest pain or shortness of breath. Discussed hospital precautions with patient.     Further disposition pending results if labs check today. Discussed med's effects and SE's.   Over 30 minutes of face to face interview, exam, counseling, chart review, and critical decision making was performed.   No future appointments.  ------------------------------------------------------------------------------------------------------------------   HPI 39 y.o.female presents for cough and sore throat.  She reports that the symptoms started 3 days ago.   She reports the cough is productive at times. She reports the sore throat was very bad three days ago, has improved slightly but not resolved.  She has tried nyquil and dayquil and did not help with her symptoms. She has history of asthma and reports wheezing.  She has been using her albuterol inhaler 4-6 times a day to help with wheezing.  She typically does not use this daily and her asthma is well controlled.  She does not use a maintenance inhaler at this time. She is also having some nasal congestion and reports her nose is tender form blowing her nose so frequently.  She has used flonase in the past but not as this time.  She has not used a Neti-pot but reports her husband uses one.   Past Medical History:  Diagnosis Date  . Allergy   . Anxiety   . Bronchitis    hx of  . Depression   . Heart murmur    born with a heart murmur; pt stated "I havent seen a doctor for it since I was a kid"  . History of thyroid nodule 06/23/2015  . Pneumonia    hx of  . Thyroid nodule      No Known Allergies  Current Outpatient Medications on File Prior to Visit  Medication Sig  . albuterol (VENTOLIN HFA) 108 (90 Base) MCG/ACT inhaler Use 2 inhalations 15 minutes apart every 4 hours to rescue Asthma  . aspirin 81 MG chewable tablet Chew 81 mg by mouth daily.  Marland Kitchen azelastine (ASTELIN) 0.1 % nasal spray Place 2  sprays into both nostrils 2 (two) times daily. Use in each nostril as directed  . cyclobenzaprine (FLEXERIL) 5 MG tablet Take 2 tablets (10 mg total) by mouth every 8 (eight) hours as needed for muscle spasms.  . fluticasone (FLONASE) 50 MCG/ACT nasal spray Place 2 sprays into both nostrils daily.  Marland Kitchen levonorgestrel (MIRENA) 20 MCG/24HR IUD 1 each by Intrauterine route once.   No current facility-administered medications on file prior to visit.    ROS: all negative except above.   Physical Exam:  BP 122/86   Pulse 62   Temp 97.7 F (36.5 C)   Wt 168 lb (76.2 kg)   SpO2 98%   BMI 26.31  kg/m   General Appearance: Well nourished, in no apparent distress. Eyes: PERRLA, EOMs, conjunctiva no swelling or erythema Sinuses: Frontal/maxillary tenderness ENT/Mouth: Ext aud canals clear, serous noted bilaterally. TMs without erythema, bulging. Erythema, swelling, post pharynx, no exudate noted.  Tonsils erythematous.  Hearing normal.  Neck: Supple, thyroid normal.  Respiratory: Respiratory effort normal, BS equal bilaterally without rales, rhonchi, wheezing or stridor.  Cardio: RRR with no MRGs. Brisk peripheral pulses without edema.  Abdomen: Soft, + BS.  Non tender, no guarding, rebound, hernias, masses. Lymphatics: Non tender without lymphadenopathy.  Musculoskeletal: Full ROM, 5/5 strength, normal gait.  Skin: Warm, dry without rashes, lesions, ecchymosis.  Neuro: Cranial nerves intact. Normal muscle tone, no cerebellar symptoms. Sensation intact.  Psych: Awake and oriented X 3, normal affect, Insight and Judgment appropriate.     Elder Negus, NP 11:36 AM [facility]

## 2020-07-27 DIAGNOSIS — Z20822 Contact with and (suspected) exposure to covid-19: Secondary | ICD-10-CM | POA: Diagnosis not present

## 2020-08-15 ENCOUNTER — Other Ambulatory Visit: Payer: Self-pay | Admitting: Adult Health Nurse Practitioner

## 2020-08-15 DIAGNOSIS — J4541 Moderate persistent asthma with (acute) exacerbation: Secondary | ICD-10-CM

## 2020-09-21 ENCOUNTER — Other Ambulatory Visit: Payer: Self-pay | Admitting: Adult Health Nurse Practitioner

## 2020-09-21 ENCOUNTER — Other Ambulatory Visit: Payer: Self-pay | Admitting: Adult Health

## 2020-09-21 DIAGNOSIS — J4541 Moderate persistent asthma with (acute) exacerbation: Secondary | ICD-10-CM

## 2020-09-21 DIAGNOSIS — J069 Acute upper respiratory infection, unspecified: Secondary | ICD-10-CM

## 2021-05-15 ENCOUNTER — Other Ambulatory Visit: Payer: Self-pay

## 2021-05-15 ENCOUNTER — Encounter: Payer: Self-pay | Admitting: Nurse Practitioner

## 2021-05-15 ENCOUNTER — Ambulatory Visit (INDEPENDENT_AMBULATORY_CARE_PROVIDER_SITE_OTHER): Payer: 59 | Admitting: Nurse Practitioner

## 2021-05-15 VITALS — BP 120/70 | HR 72 | Temp 97.7°F | Wt 178.0 lb

## 2021-05-15 DIAGNOSIS — J069 Acute upper respiratory infection, unspecified: Secondary | ICD-10-CM | POA: Diagnosis not present

## 2021-05-15 DIAGNOSIS — J4521 Mild intermittent asthma with (acute) exacerbation: Secondary | ICD-10-CM

## 2021-05-15 DIAGNOSIS — K130 Diseases of lips: Secondary | ICD-10-CM | POA: Diagnosis not present

## 2021-05-15 DIAGNOSIS — Z1152 Encounter for screening for COVID-19: Secondary | ICD-10-CM | POA: Diagnosis not present

## 2021-05-15 LAB — POC COVID19 BINAXNOW: SARS Coronavirus 2 Ag: NEGATIVE

## 2021-05-15 MED ORDER — AZITHROMYCIN 250 MG PO TABS: ORAL_TABLET | ORAL | 1 refills | Status: DC

## 2021-05-15 MED ORDER — IPRATROPIUM-ALBUTEROL 0.5-2.5 (3) MG/3ML IN SOLN: 3.0000 mL | Freq: Once | RESPIRATORY_TRACT | Status: DC

## 2021-05-15 MED ORDER — IPRATROPIUM-ALBUTEROL 0.5-2.5 (3) MG/3ML IN SOLN: 3.0000 mL | RESPIRATORY_TRACT | 0 refills | Status: DC | PRN

## 2021-05-15 MED ORDER — DEXAMETHASONE 0.5 MG PO TABS: ORAL_TABLET | ORAL | 0 refills | Status: DC

## 2021-05-15 NOTE — Patient Instructions (Addendum)
Upper Respiratory Infection, Adult An upper respiratory infection (URI) is a common viral infection of the nose, throat, and upper air passages that lead to the lungs. The most common type of URI is the common cold. URIs usually get better on their own, without medicaltreatment. What are the causes? A URI is caused by a virus. You may catch a virus by: Breathing in droplets from an infected person's cough or sneeze. Touching something that has been exposed to the virus (contaminated) and then touching your mouth, nose, or eyes. What increases the risk? You are more likely to get a URI if: You are very young or very old. It is autumn or winter. You have close contact with others, such as at a daycare, school, or health care facility. You smoke. You have long-term (chronic) heart or lung disease. You have a weakened disease-fighting (immune) system. You have nasal allergies or asthma. You are experiencing a lot of stress. You work in an area that has poor air circulation. You have poor nutrition. What are the signs or symptoms? A URI usually involves some of the following symptoms: Runny or stuffy (congested) nose. Sneezing. Cough. Sore throat. Headache. Fatigue. Fever. Loss of appetite. Pain in your forehead, behind your eyes, and over your cheekbones (sinus pain). Muscle aches. Redness or irritation of the eyes. Pressure in the ears or face. How is this diagnosed? This condition may be diagnosed based on your medical history and symptoms, and a physical exam. Your health care provider may use a cotton swab to take a mucus sample from your nose (nasal swab). This sample can be tested to determine what virus is causing the illness. How is this treated? URIs usually get better on their own within 7-10 days. You can take steps at home to relieve your symptoms. Medicines cannot cure URIs, but your health care provider may recommend certain medicines to help relieve symptoms, such  as: Over-the-counter cold medicines. Cough suppressants. Coughing is a type of defense against infection that helps to clear the respiratory system, so take these medicines only as recommended by your health care provider. Fever-reducing medicines. Follow these instructions at home: Activity Rest as needed. If you have a fever, stay home from work or school until your fever is gone or until your health care provider says you are no longer contagious. Your health care provider may have you wear a face mask to prevent your infection from spreading. Relieving symptoms Gargle with a salt-water mixture 3-4 times a day or as needed. To make a salt-water mixture, completely dissolve -1 tsp of salt in 1 cup of warm water. Use a cool-mist humidifier to add moisture to the air. This can help you breathe more easily. Eating and drinking  Drink enough fluid to keep your urine pale yellow. Eat soups and other clear broths.  General instructions  Take over-the-counter and prescription medicines only as told by your health care provider. These include cold medicines, fever reducers, and cough suppressants. Do not use any products that contain nicotine or tobacco, such as cigarettes and e-cigarettes. If you need help quitting, ask your health care provider. Stay away from secondhand smoke. Stay up to date on all immunizations, including the yearly (annual) flu vaccine. Keep all follow-up visits as told by your health care provider. This is important.  How to prevent the spread of infection to others  URIs can be passed from person to person (are contagious). To prevent the infection from spreading: Wash your hands often with soap  and water. If soap and water are not available, use hand sanitizer. Avoid touching your mouth, face, eyes, or nose. Cough or sneeze into a tissue or your sleeve or elbow instead of into your hand or into the air.  Contact a health care provider if: You are getting worse  instead of better. You have a fever or chills. Your mucus is brown or red. You have yellow or brown discharge coming from your nose. You have pain in your face, especially when you bend forward. You have swollen neck glands. You have pain while swallowing. You have white areas in the back of your throat. Get help right away if: You have shortness of breath that gets worse. You have severe or persistent: Headache. Ear pain. Sinus pain. Chest pain. You have chronic lung disease along with any of the following: Wheezing. Prolonged cough. Coughing up blood. A change in your usual mucus. You have a stiff neck. You have changes in your: Vision. Hearing. Thinking. Mood. Summary An upper respiratory infection (URI) is a common infection of the nose, throat, and upper air passages that lead to the lungs. A URI is caused by a virus. URIs usually get better on their own within 7-10 days. Medicines cannot cure URIs, but your health care provider may recommend certain medicines to help relieve symptoms. This information is not intended to replace advice given to you by your health care provider. Make sure you discuss any questions you have with your healthcare provider. Document Revised: 06/01/2020 Document Reviewed: 06/01/2020 Elsevier Patient Education  2022 [employer] Mucocele of the Mouth A mucocele is a growth or bump (cyst) that is filled with mucus. A mucocele can form on many parts of the mouth, including the gums, the tongue, and the inside of the cheeks. A common spot is inside the lower lip. Mucoceles are not dangerous, and they are usually not painful. A mucocele can be uncomfortable if it is very large or if it islocated under the tongue. Small mucoceles often clear up on their own. Treatment may not be needed.Mucoceles that are large or that keep coming back might need to be removed. What are the causes? Mucoceles form when the salivary ducts in the mouth are damaged and  leak saliva. These ducts carry saliva from the salivary glands to the surface of the mouth. A mucocele can develop because of: An injury to your mouth. Sucking or biting on your lips or tongue. A blocked salivary duct. This is sometimes caused by swelling. Piercing of the tongue or lip for jewelry. In some cases, the cause may not be known. What are the signs or symptoms? Symptoms of this condition include a smooth, painless bump inside your mouth. The bump may: Show up all of a sudden. Have thin walls and a bluish color. Change in size. Most are smaller than  inch (1.3 cm). Mucoceles under the tongue are called ranulas. These may be bigger and may push your tongue up and back. In some cases, this can make it hard to talk, swallow,or breathe. How is this diagnosed? This condition is usually diagnosed with a physical exam. Your health care provider will often be able to tell if you have a mucocele by looking at it and feeling it. You may also have tests, such as: An ultrasound to check for problems with your salivary gland. An X-ray to see if stones are blocking the exit of saliva. How is this treated? Treatment may depend on the size of the mucocele: For  a small mucocele, treatment is usually not needed. It will drain on its own and go away. For larger mucoceles and ranulas, surgery may be needed. This may be done if the mucocele does not go away or if it keeps coming back. The entire mucocele may be taken out. In some cases, the salivary gland may also be removed. Follow these instructions at home: Take over-the-counter and prescription medicines only as told by your health care provider. Do not try to drain a mucocele on your own. Do not poke a hole in it. Do not use any products that contain nicotine or tobacco, such as cigarettes and e-cigarettes. If you need help quitting, ask your health care provider. Do not suck or bite on your lips or tongue. If your mucocele was removed, avoid  hard, sharp, or spicy foods and foods that have high acidity while your mouth is healing. Keep all follow-up visits as told by your health care provider. This is important. Contact a health care provider if: You have a lump or cyst inside your mouth that does not go away. You have a fever. Get help right away if: You have a lump or cyst in your mouth that: Becomes painful. Gets large very fast. You have a lump or cyst in your mouth that makes it hard to: Swallow. Talk. Breathe. Summary A mucocele is a growth or bump (cyst) that is filled with mucus. A mucocele can form on many parts of the mouth. Mucoceles are usually not painful. A mucocele can be uncomfortable if it is very large or if it is located under the tongue. For a small mucocele, treatment is usually not needed. It will drain on its own and go away. Larger mucoceles may need to be surgically removed. Do not try to drain a mucocele on your own. Do not poke a hole in it. This information is not intended to replace advice given to you by your health care provider. Make sure you discuss any questions you have with your healthcare provider. Document Revised: 02/04/2018 Document Reviewed: 02/04/2018 Elsevier Patient Education  2022 [employer].

## 2021-05-15 NOTE — Progress Notes (Signed)
cAssessment and Plan:  Di was seen today for cough, sore throat, headache and wheezing.  Diagnoses and all orders for this visit:  Encounter for screening for COVID-19 -     POC COVID-19  Upper respiratory tract infection, unspecified type -     dexamethasone (DECADRON) 0.5 MG tablet; Take 1 tablet PO BID for 5 days and then 1 tablet PO QDaily for 5 days. -     azithromycin (ZITHROMAX) 250 MG tablet; Take 2 tablets (500 mg) on  Day 1,  followed by 1 tablet (250 mg) once daily on Days 2 through 5. -     ipratropium-albuterol (DUONEB) 0.5-2.5 (3) MG/3ML nebulizer solution 3 mL -     ipratropium-albuterol (DUONEB) 0.5-2.5 (3) MG/3ML SOLN; Take 3 mLs by nebulization every 4 (four) hours as needed. Max:6 doses per day - -Little Remedies saline spray (aerosol/mist)- can try this, it is in the kids section - pseudoephedrine (Sudafed)- behind the counter, do not use if you have high blood pressure, medicine that have -D in them.  - phenylephrine (Sudafed PE) -Dextormethorphan + chlorpheniramine (Coridcidin HBP)- okay if you have high blood pressure -Oxymetazoline (Afrin) nasal spray- LIMIT to 3 days -Saline nasal spray -Neti pot (used distilled or bottled water)   - Vitamin C, Zinc and Vit D    Mucocele of lower lip       - I&D of mucocele.  Pt will monitor if not greatly improved by Friday she is to return for removal of sacI  Intermittent asthma with acute exacerbation, unspecified asthma severity -     ipratropium-albuterol (DUONEB) 0.5-2.5 (3) MG/3ML SOLN; Take 3 mLs by nebulization every 4 (four) hours as needed. Max:6 doses per day - Pt is purchasing nebulizer to do home Duo neb treatments for asthma exacerbations  Instructed on use.       Further disposition pending results of labs. Discussed med's effects and SE's.   Over 30 minutes of exam, counseling, chart review, and critical decision making was performed.   No future  appointments.   ------------------------------------------------------------------------------------------------------------------   HPI BP 120/70   Pulse 72   Temp 97.7 F (36.5 C)   Wt 178 lb (80.7 kg)   SpO2 97%   BMI 27.88 kg/m  39 y.o.female presents for 10 days of shortness of breath related to allergic asthma exacerbation.  Congestion, dry cough, runny nose, sneezing, muscle aches.  Denies fever.   Past Medical History:  Diagnosis Date   Allergy    Anxiety    Bronchitis    hx of   Depression    Heart murmur    born with a heart murmur; pt stated "I havent seen a doctor for it since I was a kid"   History of thyroid nodule 06/23/2015   Pneumonia    hx of   Thyroid nodule      No Known Allergies  Current Outpatient Medications on File Prior to Visit  Medication Sig   albuterol (VENTOLIN HFA) 108 (90 Base) MCG/ACT inhaler Use 2 inhalations 15 minutes apart every 4 hours to rescue Asthma   aspirin 81 MG chewable tablet Chew 81 mg by mouth daily.   cetirizine (ZYRTEC) 10 MG tablet Take 10 mg by mouth daily.   levonorgestrel (MIRENA) 20 MCG/24HR IUD 1 each by Intrauterine route once.   fluticasone (FLONASE) 50 MCG/ACT nasal spray SPRAY 2 SPRAYS INTO EACH NOSTRIL EVERY DAY   No current facility-administered medications on file prior to visit.   Review of Systems  Constitutional:  Positive for chills and malaise/fatigue. Negative for fever.  HENT:  Positive for congestion, sinus pain, sore throat and tinnitus. Negative for hearing loss.   Eyes:  Negative for blurred vision and double vision.  Respiratory:  Positive for cough (nonproductive), shortness of breath and wheezing.   Cardiovascular:  Negative for chest pain, palpitations and leg swelling.  Gastrointestinal:  Negative for abdominal pain, constipation, diarrhea, heartburn, nausea and vomiting.  Genitourinary:  Negative for dysuria and urgency.  Musculoskeletal:  Positive for myalgias. Negative for joint pain.   Skin:        Mucocele lip x 1 week  Neurological:  Negative for dizziness and headaches.  Endo/Heme/Allergies:  Does not bruise/bleed easily.  Psychiatric/Behavioral:  Negative for depression.      Physical Exam:  BP 120/70   Pulse 72   Temp 97.7 F (36.5 C)   Wt 178 lb (80.7 kg)   SpO2 97%   BMI 27.88 kg/m   General Appearance: Well nourished, in no apparent distress. Eyes: PERRLA, EOMs, conjunctiva no swelling or erythema Sinuses: No Frontal/maxillary tenderness ENT/Mouth: Ext aud canals clear, TMs without erythema, bulging. No erythema, swelling, or exudate on post pharynx.  Nares erythematous bilaterally, Tonsils not swollen or erythematous. Clear post nasal drip noted. Hearing normal.  Neck: Supple, thyroid normal.  Respiratory: Respiratory effort normal, expiratory wheezes bilaterally, dry cough Cardio: RRR with no MRGs. Brisk peripheral pulses without edema.  Abdomen: Soft, + BS.  Non tender, no guarding, rebound, hernias, masses. Lymphatics: Non tender without lymphadenopathy.  Musculoskeletal: Full ROM, 5/5 strength, normal gait.  Skin: Warm, dry without rashes, lesions, ecchymosis.  Neuro: Cranial nerves intact. Normal muscle tone, no cerebellar symptoms. Sensation intact.  Psych: Awake and oriented X 3, normal affect, Insight and Judgment appropriate.    PROCEDURE NOTE: I&D of Mucocele Verbal consent obtained. Local anesthesia with 1 cc of Bupivacaine Marcaine 0.5%. Site cleansed   Incision of   0.5 cm was made using a 11 blade, discharge of minimal amount of mucus.  Cleansed and dressed. After care instructions provided. Patient to return to clinic on 3 days if mucocele is not greatly improved.     Revonda Humphrey, NP 3:50 PM [facility]

## 2021-06-04 ENCOUNTER — Encounter: Payer: Self-pay | Admitting: Adult Health

## 2021-06-04 ENCOUNTER — Ambulatory Visit (INDEPENDENT_AMBULATORY_CARE_PROVIDER_SITE_OTHER): Payer: 59 | Admitting: Adult Health

## 2021-06-04 ENCOUNTER — Other Ambulatory Visit: Payer: Self-pay

## 2021-06-04 VITALS — BP 110/60 | HR 65 | Temp 99.0°F | Wt 179.0 lb

## 2021-06-04 DIAGNOSIS — J069 Acute upper respiratory infection, unspecified: Secondary | ICD-10-CM | POA: Diagnosis not present

## 2021-06-04 DIAGNOSIS — J029 Acute pharyngitis, unspecified: Secondary | ICD-10-CM

## 2021-06-04 DIAGNOSIS — Z1152 Encounter for screening for COVID-19: Secondary | ICD-10-CM | POA: Diagnosis not present

## 2021-06-04 LAB — POCT RAPID STREP A (OFFICE): Rapid Strep A Screen: NEGATIVE

## 2021-06-04 LAB — POC COVID19 BINAXNOW: SARS Coronavirus 2 Ag: NEGATIVE

## 2021-06-04 MED ORDER — DEXAMETHASONE 0.5 MG PO TABS: ORAL_TABLET | ORAL | 0 refills | Status: DC

## 2021-06-04 NOTE — Patient Instructions (Addendum)
Rapid step test may have some false negatives. If not improving in 3-4 days or getting worse despite the steroid taper, please contact me back and will send in augmentin for presumptive treatment strep throat.   -Make sure you are drinking plenty of fluids to stay hydrated.    - voice rest - avoid sugar, dairy -you can do salt water gargles. You can also do 1 TSP liquid Maalox and 1 TSP liquid benadryl- mix/ gargle/ spit   Suggest Flonase 1-2 sprays in each nostril twice daily if recurrent ear pressure (only need to do this once completed steroid taper - flonase is topical steroid)  -while drinking fluids pinch and hold nose close and swallow, to help open eustachian tubes and drain fluid behind ear drums.  If you are not feeling better in another week, please follow up in office.   Pharyngitis Pharyngitis is redness, pain, and swelling (inflammation) of your pharynx.   CAUSES   Pharyngitis is usually caused by infection. Most of the time, these infections are from viruses (viral) and are part of a cold. However, sometimes pharyngitis is caused by bacteria (bacterial). Pharyngitis can also be caused by allergies. Viral pharyngitis may be spread from person to person by coughing, sneezing, and personal items or utensils (cups, forks, spoons, toothbrushes). Bacterial pharyngitis may be spread from person to person by more intimate contact, such as kissing.   SIGNS AND SYMPTOMS   Symptoms of pharyngitis include:   Sore throat.   Tiredness (fatigue).   Low-grade fever.   Headache. Joint pain and muscle aches. Skin rashes. Swollen lymph nodes. Plaque-like film on throat or tonsils (often seen with bacterial pharyngitis). DIAGNOSIS   Your health care provider will ask you questions about your illness and your symptoms. Your medical history, along with a physical exam, is often all that is needed to diagnose pharyngitis. Sometimes, a rapid strep test is done. Other lab tests may also be done,  depending on the suspected cause.   TREATMENT   Viral pharyngitis will usually get better in 3-4 days without the use of medicine. Bacterial pharyngitis is treated with medicines that kill germs (antibiotics).   HOME CARE INSTRUCTIONS   Drink enough water and fluids to keep your urine clear or pale yellow.   Only take over-the-counter or prescription medicines as directed by your health care provider:   If you are prescribed antibiotics, make sure you finish them even if you start to feel better.   Do not take aspirin.   Get lots of rest.   Gargle with 8 oz of salt water ( tsp of salt per 1 qt of water) as often as every 1-2 hours to soothe your throat.   Throat lozenges (if you are not at risk for choking) or sprays may be used to soothe your throat. SEEK MEDICAL CARE IF:   You have large, tender lumps in your neck. You have a rash. You cough up green, yellow-brown, or bloody spit. SEEK IMMEDIATE MEDICAL CARE IF:   Your neck becomes stiff. You drool or are unable to swallow liquids. You vomit or are unable to keep medicines or liquids down. You have severe pain that does not go away with the use of recommended medicines. You have trouble breathing (not caused by a stuffy nose). MAKE SURE YOU:   Understand these instructions. Will watch your condition. Will get help right away if you are not doing well or get worse. Document Released: 09/23/2005 Document Revised: 07/14/2013 Document Reviewed: 05/31/2013 ExitCare  Patient Information 2015 [employer]. This information is not intended to replace advice given to you by your health care provider. Make sure you discuss any questions you have with your health care provider.  

## 2021-06-04 NOTE — Addendum Note (Signed)
Addended by: Dionicio Stall on: 06/04/2021 01:36 PM   Modules accepted: Orders

## 2021-06-04 NOTE — Progress Notes (Signed)
Assessment and Plan:  Jennifer Ponce was seen today for acute visit.  Diagnoses and all orders for this visit:  Acute pharyngitis, unspecified etiology Throat not severely inflamed; rapid covid and strep was negative, discussed possibility of false negative covid 19 within first 3 days, will check PCR considering current local transmission rates Alternatively simple viral URI Continue allergy treatments; can add Flonase for congestion/serous otitis Pharyngitis: take medicationss as prescribed, increase fluids,  Salt water gargles. If symptoms do not improve in 5-7 days or get worse contact the office or go to ER. Will plan to give Augmentin if worse despite steroid taper and PCR is negative -     POCT rapid strep A -     Covid 19 PCR -     dexamethasone (DECADRON) 0.5 MG tablet; Take 1 tablet PO BID for 5 days and then 1 tablet PO QDaily for 5 days.  Encounter for screening for COVID-19 -     POC COVID-19  Further disposition pending results of labs. Discussed med's effects and SE's.   Over 30 minutes of exam, counseling, chart review, and critical decision making was performed.   Future Appointments  Date Time Provider Department Center  10/02/2021  2:00 PM Judd Gaudier, NP [facility] None    ------------------------------------------------------------------------------------------------------------------   HPI BP 110/60   Pulse 65   Temp 99 F (37.2 C)   Wt 179 lb (81.2 kg)   SpO2 99%   BMI 28.04 kg/m  Jennifer Ponce with hx of allergies and asthma presents for evaluation of URI sx. She was vaccinated 2/2 + booster. She tested negative rapid covid 19 today prior to appointment.   She reports sx began 3 days ago with sore throat, progressive overnight, fatigue day 2, also with rhinitis and congestion, also sneezing. Denies fever, reports has had intermittent temp, up to 100.5. With chills. Also notes mild body aches and mildly fatigued. Hasn't tried any particular treatments.  Main concern is possible strep, was prone to this when younger. No known sick contacts.   She does have summer allergies, takes daily zyrtec which works very well for her.  Notably had asthma flare 05/15/2021 and completed zpak and decadron taper a week ago.    Past Medical History:  Diagnosis Date   Allergy    Anxiety    Bronchitis    hx of   Depression    Heart murmur    born with a heart murmur; pt stated "I havent seen a doctor for it since I was a kid"   History of thyroid nodule 06/23/2015   Pneumonia    hx of   Thyroid nodule      No Known Allergies  Current Outpatient Medications on File Prior to Visit  Medication Sig   albuterol (VENTOLIN HFA) 108 (90 Base) MCG/ACT inhaler Use 2 inhalations 15 minutes apart every 4 hours to rescue Asthma   aspirin 81 MG chewable tablet Chew 81 mg by mouth daily.   cetirizine (ZYRTEC) 10 MG tablet Take 10 mg by mouth daily.   fluticasone (FLONASE) 50 MCG/ACT nasal spray SPRAY 2 SPRAYS INTO EACH NOSTRIL EVERY DAY   ipratropium-albuterol (DUONEB) 0.5-2.5 (3) MG/3ML SOLN Take 3 mLs by nebulization every 4 (four) hours as needed. Max:6 doses per day   levonorgestrel (MIRENA) 20 MCG/24HR IUD 1 each by Intrauterine route once.   Current Facility-Administered Medications on File Prior to Visit  Medication   ipratropium-albuterol (DUONEB) 0.5-2.5 (3) MG/3ML nebulizer solution 3 mL   Surgical History:  She  has a past surgical history that includes Thyroid lobectomy (Left, 05/11/2015) and Thyroidectomy (N/A, 05/11/2015). Family History:  Herfamily history includes Brain cancer in her maternal grandfather; Breast cancer in her maternal aunt; Heart disease in her maternal grandmother. Social History:   reports that she quit smoking about 13 years ago. Her smoking use included cigarettes. She has a 5.50 pack-year smoking history. She has never used smokeless tobacco. She reports current alcohol use. She reports that she does not use drugs.    ROS:  all negative except above.   Physical Exam:  BP 110/60   Pulse 65   Temp 99 F (37.2 C)   Wt 179 lb (81.2 kg)   SpO2 99%   BMI 28.04 kg/m   General Appearance: Well nourished, in no apparent distress. Eyes: PERRLA, EOMs, conjunctiva no swelling or erythema Sinuses: No Frontal/maxillary tenderness ENT/Mouth: Ext aud canals clear, TMs without erythema, do appear mildly distended. Mild erythema without swelling, or exudate on post pharynx.  Tonsils not swollen or erythematous. Hearing normal.  Neck: Supple, thyroid normal.  Respiratory: Respiratory effort normal, BS equal bilaterally without rales, rhonchi, wheezing or stridor.  Cardio: RRR with no MRGs. Brisk peripheral pulses without edema.  Abdomen: Soft, + BS.  Non tender.  Lymphatics: Non tender without lymphadenopathy.  Musculoskeletal: normal gait.  Skin: Warm, dry without rashes, lesions, ecchymosis.  Neuro: Cranial nerves intact. Normal muscle tone.  Psych: Awake and oriented X 3, normal affect, Insight and Judgment appropriate.   Dan Maker, NP 12:58 PM [facility]

## 2021-06-05 LAB — SARS-COV-2 RNA,(COVID-19) QUALITATIVE NAAT: SARS CoV2 RNA: NOT DETECTED

## 2021-06-07 ENCOUNTER — Other Ambulatory Visit: Payer: Self-pay | Admitting: Nurse Practitioner

## 2021-06-07 DIAGNOSIS — J4521 Mild intermittent asthma with (acute) exacerbation: Secondary | ICD-10-CM

## 2021-06-07 DIAGNOSIS — J069 Acute upper respiratory infection, unspecified: Secondary | ICD-10-CM

## 2021-06-12 ENCOUNTER — Other Ambulatory Visit: Payer: Self-pay | Admitting: Adult Health

## 2021-06-12 ENCOUNTER — Other Ambulatory Visit: Payer: Self-pay | Admitting: Adult Health Nurse Practitioner

## 2021-06-12 DIAGNOSIS — J069 Acute upper respiratory infection, unspecified: Secondary | ICD-10-CM

## 2021-06-12 MED ORDER — AMOXICILLIN-POT CLAVULANATE 875-125 MG PO TABS: 1.0000 | ORAL_TABLET | Freq: Two times a day (BID) | ORAL | 0 refills | Status: DC

## 2021-06-14 ENCOUNTER — Other Ambulatory Visit: Payer: Self-pay | Admitting: Adult Health

## 2021-06-14 MED ORDER — DOXYCYCLINE HYCLATE 100 MG PO CAPS: ORAL_CAPSULE | ORAL | 0 refills | Status: DC

## 2021-07-12 ENCOUNTER — Encounter: Payer: BC Managed Care – PPO | Admitting: Adult Health

## 2021-09-19 NOTE — Progress Notes (Signed)
Assessment and Plan:   Cattaleya was seen today for acute visit.  Diagnoses and all orders for this visit:  Muscle spasm -     predniSONE (DELTASONE) 20 MG tablet; 3 tablets daily with food for 3 days, 2 tabs daily for 3 days, 1 tab a day for 5 days. -     cyclobenzaprine (FLEXERIL) 5 MG tablet; Take 1 tablet (5 mg total) by mouth 3 (three) times daily as needed for muscle spasms.  Can alternate heat and ice as needed  Use Advil 600mg  q 6 hours for the next 3-5 days  If pain is not greatly improved by early next week please call the office   Further disposition pending results of labs. Discussed med's effects and SE's.   Over 30 minutes of exam, counseling, chart review, and critical decision making was performed.   Future Appointments  Date Time Provider Department Center  10/02/2021  2:00 PM 10/04/2021, NP [facility] None    ------------------------------------------------------------------------------------------------------------------   HPI BP 102/60    Pulse 75    Temp 97.7 F (36.5 C)    Wt 178 lb 9.6 oz (81 kg)    SpO2 97%    BMI 27.97 kg/m  40 y.o.female presents for low to mid back pain which has been ongoing x 3 weeks. She describes the pain as aching and does become sharp shooting when lifting hands over head or twisting.  When she sits too long or stands too long the pain is worse. Advil has helped a little as well as alternating heat and cold.    Past Medical History:  Diagnosis Date   Allergy    Anxiety    Bronchitis    hx of   Depression    Heart murmur    born with a heart murmur; pt stated "I havent seen a doctor for it since I was a kid"   History of thyroid nodule 06/23/2015   Pneumonia    hx of   Thyroid nodule      No Known Allergies  Current Outpatient Medications on File Prior to Visit  Medication Sig   albuterol (VENTOLIN HFA) 108 (90 Base) MCG/ACT inhaler USE 2 INHALATIONS 15 MINUTES APART EVERY 4 HOURS TO RESCUE ASTHMA   cetirizine  (ZYRTEC) 10 MG tablet Take 10 mg by mouth daily.   fluticasone (FLONASE) 50 MCG/ACT nasal spray SPRAY 2 SPRAYS INTO EACH NOSTRIL EVERY DAY   ipratropium-albuterol (DUONEB) 0.5-2.5 (3) MG/3ML SOLN TAKE 3 MLS BY NEBULIZATION EVERY 4 (FOUR) HOURS AS NEEDED. MAX:6 DOSES PER DAY   levonorgestrel (MIRENA) 20 MCG/24HR IUD 1 each by Intrauterine route once.   aspirin 81 MG chewable tablet Chew 81 mg by mouth daily. (Patient not taking: Reported on 09/20/2021)   doxycycline (VIBRAMYCIN) 100 MG capsule Take 1 capsule twice daily with food (Patient not taking: Reported on 09/20/2021)   Current Facility-Administered Medications on File Prior to Visit  Medication   ipratropium-albuterol (DUONEB) 0.5-2.5 (3) MG/3ML nebulizer solution 3 mL    ROS: all negative except above.   Physical Exam:  BP 102/60    Pulse 75    Temp 97.7 F (36.5 C)    Wt 178 lb 9.6 oz (81 kg)    SpO2 97%    BMI 27.97 kg/m   General Appearance: Well nourished, in no apparent distress. Eyes: PERRLA, EOMs, conjunctiva no swelling or erythema Sinuses: No Frontal/maxillary tenderness ENT/Mouth: Ext aud canals clear, TMs without erythema, bulging. No erythema, swelling, or exudate on post  pharynx.  Tonsils not swollen or erythematous. Hearing normal.  Neck: Supple, thyroid normal.  Respiratory: Respiratory effort normal, BS equal bilaterally without rales, rhonchi, wheezing or stridor.  Cardio: RRR with no MRGs. Brisk peripheral pulses without edema.  Abdomen: Soft, + BS.  Non tender, no guarding, rebound, hernias, masses. Lymphatics: Non tender without lymphadenopathy.  Musculoskeletal: Full ROM, 5/5 strength, normal gait. Muscle spasm right latissimus dorsi  Skin: Warm, dry without rashes, lesions, ecchymosis.  Neuro: Cranial nerves intact. Normal muscle tone, no cerebellar symptoms. Sensation intact.  Psych: Awake and oriented X 3, normal affect, Insight and Judgment appropriate.     Revonda Humphrey, NP 4:23 PM [facility]

## 2021-09-20 ENCOUNTER — Other Ambulatory Visit: Payer: Self-pay

## 2021-09-20 ENCOUNTER — Encounter: Payer: Self-pay | Admitting: Nurse Practitioner

## 2021-09-20 ENCOUNTER — Ambulatory Visit (INDEPENDENT_AMBULATORY_CARE_PROVIDER_SITE_OTHER): Payer: 59 | Admitting: Nurse Practitioner

## 2021-09-20 VITALS — BP 102/60 | HR 75 | Temp 97.7°F | Wt 178.6 lb

## 2021-09-20 DIAGNOSIS — M62838 Other muscle spasm: Secondary | ICD-10-CM

## 2021-09-20 MED ORDER — PREDNISONE 20 MG PO TABS
ORAL_TABLET | ORAL | 0 refills | Status: AC
Start: 2021-09-20 — End: 2021-10-01

## 2021-09-20 MED ORDER — CYCLOBENZAPRINE HCL 5 MG PO TABS: 5.0000 mg | ORAL_TABLET | Freq: Three times a day (TID) | ORAL | 0 refills | Status: DC | PRN

## 2021-09-20 NOTE — Patient Instructions (Signed)
Muscle Cramps and Spasms Muscle cramps and spasms occur when a muscle or muscles tighten and you have no control over this tightening (involuntary muscle contraction). They are a common problem and can develop in any muscle. The most common place is in the calf muscles of the leg. Muscle cramps and muscle spasms are both involuntary muscle contractions, but there are some differences between the two: Muscle cramps are painful. They come and go and may last for a few seconds or up to 15 minutes. Muscle cramps are often more forceful and last longer than muscle spasms. Muscle spasms may or may not be painful. They may also last just a few seconds or much longer. Certain medical conditions, such as diabetes or Parkinson's disease, can make it more likely to develop cramps or spasms. However, cramps or spasms are usually not caused by a serious underlying problem. Common causes include: Doing more physical work or exercise than your body is ready for (overexertion). Overuse from repeating certain movements too many times. Remaining in a certain position for a long period of time. Improper preparation, form, or technique while playing a sport or doing an activity. Dehydration. Injury. Side effects of some medicines. Abnormally low levels of the salts and minerals in your blood (electrolytes), especially potassium and calcium. This could happen if you are taking water pills (diuretics) or if you are pregnant. In many cases, the cause of muscle cramps or spasms is not known. Follow these instructions at home: Managing pain and stiffness   Try massaging, stretching, and relaxing the affected muscle. Do this for several minutes at a time. If directed, apply heat to tight or tense muscles as often as told by your health care provider. Use the heat source that your health care provider recommends, such as a moist heat pack or a heating pad. Place a towel between your skin and the heat source. Leave the heat  on for 20-30 minutes. Remove the heat if your skin turns bright red. This is especially important if you are unable to feel pain, heat, or cold. You may have a greater risk of getting burned. If directed, put ice on the affected area. This may help if you are sore or have pain after a cramp or spasm. Put ice in a plastic bag. Place a towel between your skin and the bag. Leave the ice on for 20 minutes, 2-3 times a day. Try taking hot showers or baths to help relax tight muscles. Eating and drinking Drink enough fluid to keep your urine pale yellow. Staying well hydrated may help prevent cramps or spasms. Eat a healthy diet that includes plenty of nutrients to help your muscles function. A healthy diet includes fruits and vegetables, lean protein, whole grains, and low-fat or nonfat dairy products. General instructions If you are having frequent cramps, avoid intense exercise for several days. Take over-the-counter and prescription medicines only as told by your health care provider. Pay attention to any changes in your symptoms. Keep all follow-up visits as told by your health care provider. This is important. Contact a health care provider if: Your cramps or spasms get more severe or happen more often. Your cramps or spasms do not improve over time. Summary Muscle cramps and spasms occur when a muscle or muscles tighten and you have no control over this tightening (involuntary muscle contraction). The most common place for cramps or spasms to occur is in the calf muscles of the leg. Massaging, stretching, and relaxing the affected muscle may   relieve the cramp or spasm. Drink enough fluid to keep your urine pale yellow. Staying well hydrated may help prevent cramps or spasms. This information is not intended to replace advice given to you by your health care provider. Make sure you discuss any questions you have with your health care provider. Document Revised: 04/13/2021 Document Reviewed:  04/13/2021 Elsevier Patient Education  2022 [employer] Inc[employer]  

## 2021-09-28 NOTE — Progress Notes (Deleted)
Complete Physical  Assessment and Plan:  Encounter for Annual Physical Exam with abnormal findings Due annually  Health Maintenance reviewed Healthy lifestyle reviewed and goals set   Depression, unspecified depression type CONTINUE TOPAMAX  Insulin resistance` Monitor weight  History of thyroid nodule S/P partial thyroidectomy Monitor TSH  Asthma, chronic, unspecified asthma severity, uncomplicated Avoid triggers, continue meds  Screening cholesterol level -     Lipid panel  Medication management -     CBC with Differential/Platelet -     CMP/GFR -     Magnesium  Vitamin D deficiency -     VITAMIN D 25 Hydroxy (Vit-D Deficiency, Fractures)  Screening for blood or protein in urine -     Urinalysis, Routine w reflex microscopic -     Microalbumin / creatinine urine ratio  Needs flu shot -     FLU VACCINE MDCK QUAD W/Preservative    Future Appointments  Date Time Provider Department Center  10/02/2021  2:00 PM Judd Gaudier, NP [facility] None    HPI 40 y.o. female patient presents for complete physical.  She has Asthma; Depression, major, recurrent, in partial remission (HCC); Insulin resistance; and S/P partial thyroidectomy on their problem list.  She is married with 1 son, Francesco Runner, 33 ***?  years old  She has been on topamax for sleep/dreams and she states she is doing well other than some abnormal taste with it.   Depression ***  Has seen Dr. Maple Hudson, had PFTs 09/2013, and thought to have mild asthma, on albuterol, has had some flares with allergies.   BMI is There is no height or weight on file to calculate BMI., she {HAS HAS JSH:70263} been working on diet and exercise. Wt Readings from Last 3 Encounters:  09/20/21 178 lb 9.6 oz (81 kg)  06/04/21 179 lb (81.2 kg)  05/15/21 178 lb (80.7 kg)   Her blood pressure {HAS HAS NOT:18834} been controlled at home, today their BP is    She {DOES_DOES ZCH:88502} workout. She denies chest pain, shortness of  breath, dizziness.  Had a benign thyroid (negative FNA in 2014) nodule removal in 2016 by left partial thyroidectomy due to compressive symptoms and states she is doing well.  Lab Results  Component Value Date   TSH 0.78 06/03/2019   Last cholesterol was normal, but has had elevations previously *** Lab Results  Component Value Date   CHOL 163 07/12/2019   HDL 73 07/12/2019   LDLCALC 76 07/12/2019   TRIG 68 07/12/2019   CHOLHDL 2.2 07/12/2019   In 2014 her A1C was normal at 5.3 but she had an elevated insulin at 30.   She {Has/has not:18111} been working on diet and exercise for this, and denies increased appetite, nausea, paresthesia of the feet, polydipsia, polyuria, and visual disturbances. Last A1C in the office was very low:  Lab Results  Component Value Date   HGBA1C 4.8 07/12/2019   Patient is on Vitamin D supplement.   Lab Results  Component Value Date   VD25OH 38 07/12/2019      *** Lab Results  Component Value Date   VITAMINB12 485 07/12/2019   Lab Results  Component Value Date   IRON 217 (H) 07/12/2019   TIBC 283 07/12/2019   FERRITIN 149 07/12/2019       Current Medications:  Current Outpatient Medications on File Prior to Visit  Medication Sig   albuterol (VENTOLIN HFA) 108 (90 Base) MCG/ACT inhaler USE 2 INHALATIONS 15 MINUTES APART EVERY 4 HOURS TO  RESCUE ASTHMA   aspirin 81 MG chewable tablet Chew 81 mg by mouth daily. (Patient not taking: Reported on 09/20/2021)   cetirizine (ZYRTEC) 10 MG tablet Take 10 mg by mouth daily.   cyclobenzaprine (FLEXERIL) 5 MG tablet Take 1 tablet (5 mg total) by mouth 3 (three) times daily as needed for muscle spasms.   doxycycline (VIBRAMYCIN) 100 MG capsule Take 1 capsule twice daily with food (Patient not taking: Reported on 09/20/2021)   fluticasone (FLONASE) 50 MCG/ACT nasal spray SPRAY 2 SPRAYS INTO EACH NOSTRIL EVERY DAY   ipratropium-albuterol (DUONEB) 0.5-2.5 (3) MG/3ML SOLN TAKE 3 MLS BY NEBULIZATION EVERY 4  (FOUR) HOURS AS NEEDED. MAX:6 DOSES PER DAY   levonorgestrel (MIRENA) 20 MCG/24HR IUD 1 each by Intrauterine route once.   predniSONE (DELTASONE) 20 MG tablet 3 tablets daily with food for 3 days, 2 tabs daily for 3 days, 1 tab a day for 5 days.   Current Facility-Administered Medications on File Prior to Visit  Medication   ipratropium-albuterol (DUONEB) 0.5-2.5 (3) MG/3ML nebulizer solution 3 mL   Health Maintainance:  Immunization History  Administered Date(s) Administered   Influenza Inj Mdck Quad With Preservative 07/12/2019   Influenza-Unspecified 10/07/2017   Td 08/13/2005   Tdap 02/22/2015   Tetatus 2016 Pneumonia: *** Influenza: *** Shingrix: discuss age 54 Covid 15: ***  Pap: 02/2018, Dr. Ellyn Hack Sexually active: yes, STD testing offered but declines.  LMP: on mirena placed 2020 MGM ***  Colonoscopy: due at age 72  Eye Doctor: Dr. Hyacinth Meeker Dec 2014, wears glasses, 2 years Dentist:   Allergies:  No Known Allergies Medical History:  Past Medical History:  Diagnosis Date   Allergy    Anxiety    Bronchitis    hx of   Depression    Heart murmur    born with a heart murmur; pt stated "I havent seen a doctor for it since I was a kid"   History of thyroid nodule 06/23/2015   Pneumonia    hx of   Thyroid nodule    SURGICAL HISTORY She  has a past surgical history that includes Thyroid lobectomy (Left, 05/11/2015) and Thyroidectomy (N/A, 05/11/2015). FAMILY HISTORY Her family history includes Brain cancer in her maternal grandfather; Breast cancer in her maternal aunt; Heart disease in her maternal grandmother. SOCIAL HISTORY She  reports that she quit smoking about 14 years ago. Her smoking use included cigarettes. She has a 5.50 pack-year smoking history. She has never used smokeless tobacco. She reports current alcohol use. She reports that she does not use drugs.   Surgical History:  Review of Systems  Constitutional:  Negative for malaise/fatigue and weight  loss.  HENT:  Negative for hearing loss and tinnitus.   Eyes:  Negative for blurred vision and double vision.  Respiratory:  Negative for cough, sputum production, shortness of breath and wheezing.   Cardiovascular:  Negative for chest pain, palpitations, orthopnea, claudication, leg swelling and PND.  Gastrointestinal:  Negative for abdominal pain, blood in stool, constipation, diarrhea, heartburn, melena, nausea and vomiting.  Genitourinary: Negative.   Musculoskeletal:  Negative for falls, joint pain and myalgias.  Skin:  Negative for rash.  Neurological:  Negative for dizziness, tingling, sensory change, weakness and headaches.  Endo/Heme/Allergies:  Negative for polydipsia.  Psychiatric/Behavioral: Negative.  Negative for depression, memory loss, substance abuse and suicidal ideas. The patient is not nervous/anxious and does not have insomnia.   All other systems reviewed and are negative.  Physical Exam: Estimated body mass index  is 27.97 kg/m as calculated from the following:   Height as of 07/12/19: 5\' 7"  (1.702 m).   Weight as of 09/20/21: 178 lb 9.6 oz (81 kg). There were no vitals filed for this visit.  General Appearance: Well nourished, in no apparent distress. Eyes: PERRLA, EOMs, conjunctiva no swelling or erythema Sinuses: No Frontal/maxillary tenderness ENT/Mouth: Ext aud canals clear, normal light reflex with TMs without erythema, bulging.  Good dentition. No erythema, swelling, or exudate on post pharynx. Tonsils not swollen or erythematous. Hearing normal.  Neck: Supple, well healing horizontal thyroid scar. No bruits Respiratory: Respiratory effort normal, BS equal bilaterally without rales, rhonci, wheezing or stridor. Cardio: Heart sounds normal, regular rate and rhythm without murmurs, rubs or gallops. Peripheral pulses brisk and equal bilaterally, without edema.  Chest: symmetric, with normal excursions and percussion. Breasts: defer to GYN Abdomen: Flat, soft,  with bowl sounds, no tenderness, no guarding, rebound, hernias, masses, or organomegaly. .  Lymphatics: Non tender without lymphadenopathy Genitourinary: defer to GYN Musculoskeletal: Full ROM all peripheral extremities,5/5 strength, and normal gait. Skin: Warm, dry without rashes, lesions, ecchymosis.  Neuro: Cranial nerves intact, reflexes equal bilaterally. Normal muscle tone, no cerebellar symptoms. Sensation intact.  Pysch: Awake and oriented X 3, normal affect, Insight and Judgment appropriate.   EKG:  baseline in system, low risk, defer  09/22/21, NP 7:29 AM [facility]

## 2021-10-02 ENCOUNTER — Encounter: Payer: 59 | Admitting: Adult Health

## 2021-10-12 ENCOUNTER — Other Ambulatory Visit: Payer: Self-pay | Admitting: Nurse Practitioner

## 2021-10-12 DIAGNOSIS — J069 Acute upper respiratory infection, unspecified: Secondary | ICD-10-CM

## 2021-11-02 NOTE — Progress Notes (Deleted)
Complete Physical  Assessment and Plan:    Diagnoses and all orders for this visit:  Encounter for routine adult health examination without abnormal findings  Uncomplicated asthma, unspecified asthma severity, unspecified whether persistent  Insulin resistance  S/P partial thyroidectomy  Depression, major, recurrent, in partial remission (HCC)  BMI 26.0-26.9,adult      Future Appointments  Date Time Provider Department Center  11/06/2021  3:00 PM Judd Gaudier, NP [facility] None    HPI 41 y.o. female patient presents for complete physical.  She has Asthma; Depression, major, recurrent, in partial remission (HCC); Insulin resistance; and S/P partial thyroidectomy on their problem list.  She is married with 1 son, Francesco Runner, 31 years old  She has been presribed topamax for sleep/dreams and she states she is doing well other than some abnormal taste with it.   Has seen Dr. Maple Hudson, had PFTs 09/2013, and thought to have mild asthma, on albuterol, has had some flares with allergies.   BMI is There is no height or weight on file to calculate BMI., she {HAS HAS POE:42353} been working on diet and exercise. Wt Readings from Last 3 Encounters:  09/20/21 178 lb 9.6 oz (81 kg)  06/04/21 179 lb (81.2 kg)  05/15/21 178 lb (80.7 kg)   Her blood pressure {HAS HAS NOT:18834} been controlled at home, today their BP is    She {DOES_DOES IRW:43154} workout. She denies chest pain, shortness of breath, dizziness.  She has had a normal cholesterol,  Lab Results  Component Value Date   CHOL 163 07/12/2019   HDL 73 07/12/2019   LDLCALC 76 07/12/2019   TRIG 68 07/12/2019   CHOLHDL 2.2 07/12/2019   In 2014 her A1C was normal at 5.3 but she had an elevated insulin at 30.  No results found for: LABINSU  Lab Results  Component Value Date   HGBA1C 4.8 07/12/2019   Had benign thyroid nodule removal in 2016 due to compressive symptoms and states she is doing well.  Lab Results   Component Value Date   TSH 0.78 06/03/2019    Lab Results  Component Value Date   VD25OH 38 07/12/2019   Lab Results  Component Value Date   VITAMINB12 485 07/12/2019   Lab Results  Component Value Date   IRON 217 (H) 07/12/2019   TIBC 283 07/12/2019   FERRITIN 149 07/12/2019      Current Medications:  Current Outpatient Medications on File Prior to Visit  Medication Sig   albuterol (VENTOLIN HFA) 108 (90 Base) MCG/ACT inhaler USE 2 INHALATIONS 15 MINUTES APART EVERY 4 HOURS TO RESCUE ASTHMA   aspirin 81 MG chewable tablet Chew 81 mg by mouth daily. (Patient not taking: Reported on 09/20/2021)   cetirizine (ZYRTEC) 10 MG tablet Take 10 mg by mouth daily.   cyclobenzaprine (FLEXERIL) 5 MG tablet Take 1 tablet (5 mg total) by mouth 3 (three) times daily as needed for muscle spasms.   doxycycline (VIBRAMYCIN) 100 MG capsule Take 1 capsule twice daily with food (Patient not taking: Reported on 09/20/2021)   fluticasone (FLONASE) 50 MCG/ACT nasal spray SPRAY 2 SPRAYS INTO EACH NOSTRIL EVERY DAY   ipratropium-albuterol (DUONEB) 0.5-2.5 (3) MG/3ML SOLN TAKE 3 MLS BY NEBULIZATION EVERY 4 (FOUR) HOURS AS NEEDED. MAX:6 DOSES PER DAY   levonorgestrel (MIRENA) 20 MCG/24HR IUD 1 each by Intrauterine route once.   Current Facility-Administered Medications on File Prior to Visit  Medication   ipratropium-albuterol (DUONEB) 0.5-2.5 (3) MG/3ML nebulizer solution 3 mL   Health  Maintainance:  Immunization History  Administered Date(s) Administered   Influenza Inj Mdck Quad With Preservative 07/12/2019   Influenza-Unspecified 10/07/2017   Td 08/13/2005   Tdap 02/22/2015   Health Maintenance  Topic Date Due   COVID-19 Vaccine (1) Never done   HIV Screening  Never done   Hepatitis C Screening  Never done   PAP SMEAR-Modifier  02/10/2014   TETANUS/TDAP  02/21/2025   HPV VACCINES  Aged Out   INFLUENZA VACCINE  Discontinued   Pap: 02/2018, Dr. Ellyn HackBovard LMP: on mirena placed 2020 MGM:  ***  Colonoscopy: due age 41  Eyes: Dr. Hyacinth MeekerMiller Dec 2014, wears glasses, 2 years Dental:   Allergies:  No Known Allergies Medical History:  Past Medical History:  Diagnosis Date   Allergy    Anxiety    Bronchitis    hx of   Depression    Heart murmur    born with a heart murmur; pt stated "I havent seen a doctor for it since I was a kid"   History of thyroid nodule 06/23/2015   Pneumonia    hx of   Thyroid nodule    SURGICAL HISTORY She  has a past surgical history that includes Thyroid lobectomy (Left, 05/11/2015) and Thyroidectomy (N/A, 05/11/2015). FAMILY HISTORY Her family history includes Brain cancer in her maternal grandfather; Breast cancer in her maternal aunt; Heart disease in her maternal grandmother. SOCIAL HISTORY She  reports that she quit smoking about 14 years ago. Her smoking use included cigarettes. She has a 5.50 pack-year smoking history. She has never used smokeless tobacco. She reports current alcohol use. She reports that she does not use drugs.  *** Surgical History:  Review of Systems  Constitutional: Negative.  Negative for chills, fever, malaise/fatigue and weight loss.  HENT:  Negative for congestion, ear discharge, ear pain, hearing loss, nosebleeds, sore throat and tinnitus.   Eyes:  Negative for blurred vision and double vision.  Respiratory:  Negative for cough, hemoptysis, sputum production, shortness of breath, wheezing and stridor.   Cardiovascular: Negative.  Negative for chest pain, palpitations, orthopnea, claudication and leg swelling.  Gastrointestinal: Negative.  Negative for abdominal pain, blood in stool, constipation, diarrhea, heartburn, melena, nausea and vomiting.  Genitourinary: Negative.   Musculoskeletal:  Positive for myalgias. Negative for joint pain.  Skin: Negative.  Negative for rash.  Neurological: Negative.  Negative for dizziness, tingling, sensory change, weakness and headaches.  Endo/Heme/Allergies:  Negative for  polydipsia.  Psychiatric/Behavioral: Negative.    All other systems reviewed and are negative.  Physical Exam: Estimated body mass index is 27.97 kg/m as calculated from the following:   Height as of 07/12/19: 5\' 7"  (1.702 m).   Weight as of 09/20/21: 178 lb 9.6 oz (81 kg). There were no vitals filed for this visit.  General Appearance: Well nourished, in no apparent distress. Eyes: PERRLA, EOMs, conjunctiva no swelling or erythema, normal fundi and vessels. Sinuses: No Frontal/maxillary tenderness ENT/Mouth: Ext aud canals clear, normal light reflex with TMs without erythema, bulging.  Good dentition. No erythema, swelling, or exudate on post pharynx. Tonsils not swollen or erythematous. Hearing normal.  Neck: Supple, well healing horizontal thyroid scar. No bruits Respiratory: Respiratory effort normal, BS equal bilaterally without rales, rhonci, wheezing or stridor. Cardio: Heart sounds normal, regular rate and rhythm without murmurs, rubs or gallops. Peripheral pulses brisk and equal bilaterally, without edema.  Chest: symmetric, with normal excursions and percussion. Breasts: defer OB/GYN Abdomen: Flat, soft, with bowl sounds, no tenderness, no guarding,  rebound, hernias, masses, or organomegaly. .  Lymphatics: Non tender without lymphadenopathy Genitourinary: defer Musculoskeletal: Full ROM all peripheral extremities,5/5 strength, and normal gait. Skin: Warm, dry without rashes, lesions, ecchymosis.  Neuro: Cranial nerves intact, reflexes equal bilaterally. Normal muscle tone, no cerebellar symptoms. Sensation intact.  Pysch: Awake and oriented X 3, normal affect, Insight and Judgment appropriate.   EKG:  defer   Dan Maker, NP DNP, AGNP-C 5:37 PM [facility]

## 2021-11-06 ENCOUNTER — Encounter: Payer: 59 | Admitting: Adult Health

## 2021-11-06 DIAGNOSIS — Z1389 Encounter for screening for other disorder: Secondary | ICD-10-CM

## 2021-11-06 DIAGNOSIS — Z0000 Encounter for general adult medical examination without abnormal findings: Secondary | ICD-10-CM

## 2021-11-06 DIAGNOSIS — J45909 Unspecified asthma, uncomplicated: Secondary | ICD-10-CM

## 2021-11-06 DIAGNOSIS — F3341 Major depressive disorder, recurrent, in partial remission: Secondary | ICD-10-CM

## 2021-11-06 DIAGNOSIS — Z130 Encounter for screening for diseases of the blood and blood-forming organs and certain disorders involving the immune mechanism: Secondary | ICD-10-CM

## 2021-11-06 DIAGNOSIS — E8881 Metabolic syndrome: Secondary | ICD-10-CM

## 2021-11-06 DIAGNOSIS — Z79899 Other long term (current) drug therapy: Secondary | ICD-10-CM

## 2021-11-06 DIAGNOSIS — E890 Postprocedural hypothyroidism: Secondary | ICD-10-CM

## 2021-11-06 DIAGNOSIS — Z6826 Body mass index (BMI) 26.0-26.9, adult: Secondary | ICD-10-CM

## 2022-02-08 ENCOUNTER — Encounter: Payer: Self-pay | Admitting: Nurse Practitioner

## 2022-02-08 ENCOUNTER — Other Ambulatory Visit: Payer: Self-pay | Admitting: Adult Health

## 2022-02-08 DIAGNOSIS — J069 Acute upper respiratory infection, unspecified: Secondary | ICD-10-CM

## 2022-02-08 MED ORDER — ALBUTEROL SULFATE HFA 108 (90 BASE) MCG/ACT IN AERS: INHALATION_SPRAY | RESPIRATORY_TRACT | 0 refills | Status: DC

## 2022-07-03 ENCOUNTER — Telehealth: Payer: Self-pay

## 2022-07-03 ENCOUNTER — Telehealth: Payer: BC Managed Care – PPO | Admitting: Physician Assistant

## 2022-07-03 DIAGNOSIS — U071 COVID-19: Secondary | ICD-10-CM | POA: Diagnosis not present

## 2022-07-03 MED ORDER — FLUTICASONE PROPIONATE 50 MCG/ACT NA SUSP: NASAL | 3 refills | Status: AC

## 2022-07-03 MED ORDER — MOLNUPIRAVIR EUA 200MG CAPSULE: 4.0000 | ORAL_CAPSULE | Freq: Two times a day (BID) | ORAL | 0 refills | Status: AC

## 2022-07-03 MED ORDER — ALBUTEROL SULFATE HFA 108 (90 BASE) MCG/ACT IN AERS: INHALATION_SPRAY | RESPIRATORY_TRACT | 0 refills | Status: DC

## 2022-07-03 NOTE — Telephone Encounter (Signed)
Called, spoke with patient in regards to worse symptoms of SOB and cough. Advise her from my chart companion, Patient verb understanding. Advise given: Shortness of breath is the same:   continue to monitor at home  If symptoms become severe, i.e. shortness of breath at rest, gasping for air, wheezing, call 911 and seek treatment in the ED If cough remains the same or better: continue to treat with over the counter medications.  Hard candy or cough drops and drinking warm fluids. Adults can alsIf cough is becoming worse even with the use of over the counter medications and patient is not able to sleep at night, cough becomes productive with sputum that maybe yellow or green in color, contact PCPo use honey 2 tsp (10 ML) at bedtime.

## 2022-07-03 NOTE — Patient Instructions (Signed)
Philis Pique, thank you for joining Leeanne Rio, PA-C for today's virtual visit.  While this provider is not your primary care provider (PCP), if your PCP is located in our provider database this encounter information will be shared with them immediately following your visit.  Consent: (Patient) Jennifer Ponce provided verbal consent for this virtual visit at the beginning of the encounter.  Current Medications:  Current Outpatient Medications:    albuterol (VENTOLIN HFA) 108 (90 Base) MCG/ACT inhaler, USE 2 INHALATIONS 15 MINUTES APART EVERY 4 HOURS TO RESCUE ASTHMA, Disp: 6.7 each, Rfl: 0   aspirin 81 MG chewable tablet, Chew by mouth., Disp: , Rfl:    cetirizine (ZYRTEC) 10 MG tablet, Take 10 mg by mouth daily., Disp: , Rfl:    fluticasone (FLONASE) 50 MCG/ACT nasal spray, SPRAY 2 SPRAYS INTO EACH NOSTRIL EVERY DAY, Disp: 48 g, Rfl: 3   ipratropium-albuterol (DUONEB) 0.5-2.5 (3) MG/3ML SOLN, TAKE 3 MLS BY NEBULIZATION EVERY 4 (FOUR) HOURS AS NEEDED. MAX:6 DOSES PER DAY, Disp: 540 mL, Rfl: 2   levonorgestrel (MIRENA) 20 MCG/24HR IUD, 1 each by Intrauterine route once., Disp: , Rfl:    Medications ordered in this encounter:  No orders of the defined types were placed in this encounter.    *If you need refills on other medications prior to your next appointment, please contact your pharmacy*  Follow-Up: Call back or seek an in-person evaluation if the symptoms worsen or if the condition fails to improve as anticipated.  [place] [phone]  Other Instructions Please keep well-hydrated and get plenty of rest. Start a saline nasal rinse to flush out your nasal passages. You can use plain Mucinex to help thin congestion. If you have a humidifier, running in the bedroom at night. I want you to start OTC vitamin D3 1000 units daily, vitamin C 1000 mg daily, and a zinc supplement. Please take prescribed medications as directed.  You have been enrolled  in a MyChart symptom monitoring program. Please answer these questions daily so we can keep track of how you are doing.  You were to quarantine for 5 days from onset of your symptoms.  After day 5, if you have had no fever and you are feeling better, you can end quarantine but need to mask for an additional 5 days. After day 5 if you have a fever or are having significant symptoms, please quarantine for full 10 days.  If you note any worsening of symptoms, any significant shortness of breath or any chest pain, please seek ER evaluation ASAP.  Please do not delay care!  COVID-19: What to Do if You Are Sick If you test positive and are an older adult or someone who is at high risk of getting very sick from COVID-19, treatment may be available. Contact a healthcare provider right away after a positive test to determine if you are eligible, even if your symptoms are mild right now. You can also visit a Test to Treat location and, if eligible, receive a prescription from a provider. Don't delay: Treatment must be started within the first few days to be effective. If you have a fever, cough, or other symptoms, you might have COVID-19. Most people have mild illness and are able to recover at home. If you are sick: Keep track of your symptoms. If you have an emergency warning sign (including trouble breathing), call 911. Steps to help prevent the spread of COVID-19 if you are sick If you are  sick with COVID-19 or think you might have COVID-19, follow the steps below to care for yourself and to help protect other people in your home and community. Stay home except to get medical care Stay home. Most people with COVID-19 have mild illness and can recover at home without medical care. Do not leave your home, except to get medical care. Do not visit public areas and do not go to places where you are unable to wear a mask. Take care of yourself. Get rest and stay hydrated. Take over-the-counter medicines, such  as acetaminophen, to help you feel better. Stay in touch with your doctor. Call before you get medical care. Be sure to get care if you have trouble breathing, or have any other emergency warning signs, or if you think it is an emergency. Avoid public transportation, ride-sharing, or taxis if possible. Get tested If you have symptoms of COVID-19, get tested. While waiting for test results, stay away from others, including staying apart from those living in your household. Get tested as soon as possible after your symptoms start. Treatments may be available for people with COVID-19 who are at risk for becoming very sick. Don't delay: Treatment must be started early to be effective--some treatments must begin within 5 days of your first symptoms. Contact your healthcare provider right away if your test result is positive to determine if you are eligible. Self-tests are one of several options for testing for the virus that causes COVID-19 and may be more convenient than laboratory-based tests and point-of-care tests. Ask your healthcare provider or your local health department if you need help interpreting your test results. You can visit your state, tribal, local, and territorial health department's website to look for the latest local information on testing sites. Separate yourself from other people As much as possible, stay in a specific room and away from other people and pets in your home. If possible, you should use a separate bathroom. If you need to be around other people or animals in or outside of the home, wear a well-fitting mask. Tell your close contacts that they may have been exposed to COVID-19. An infected person can spread COVID-19 starting 48 hours (or 2 days) before the person has any symptoms or tests positive. By letting your close contacts know they may have been exposed to COVID-19, you are helping to protect everyone. See COVID-19 and Animals if you have questions about pets. If you  are diagnosed with COVID-19, someone from the health department may call you. Answer the call to slow the spread. Monitor your symptoms Symptoms of COVID-19 include fever, cough, or other symptoms. Follow care instructions from your healthcare provider and local health department. Your local health authorities may give instructions on checking your symptoms and reporting information. When to seek emergency medical attention Look for emergency warning signs* for COVID-19. If someone is showing any of these signs, seek emergency medical care immediately: Trouble breathing Persistent pain or pressure in the chest New confusion Inability to wake or stay awake Pale, gray, or blue-colored skin, lips, or nail beds, depending on skin tone *This list is not all possible symptoms. Please call your medical provider for any other symptoms that are severe or concerning to you. Call 911 or call ahead to your local emergency facility: Notify the operator that you are seeking care for someone who has or may have COVID-19. Call ahead before visiting your doctor Call ahead. Many medical visits for routine care are being postponed or done  by phone or telemedicine. If you have a medical appointment that cannot be postponed, call your doctor's office, and tell them you have or may have COVID-19. This will help the office protect themselves and other patients. If you are sick, wear a well-fitting mask You should wear a mask if you must be around other people or animals, including pets (even at home). Wear a mask with the best fit, protection, and comfort for you. You don't need to wear the mask if you are alone. If you can't put on a mask (because of trouble breathing, for example), cover your coughs and sneezes in some other way. Try to stay at least 6 feet away from other people. This will help protect the people around you. Masks should not be placed on young children under age 42 years, anyone who has trouble  breathing, or anyone who is not able to remove the mask without help. Cover your coughs and sneezes Cover your mouth and nose with a tissue when you cough or sneeze. Throw away used tissues in a lined trash can. Immediately wash your hands with soap and water for at least 20 seconds. If soap and water are not available, clean your hands with an alcohol-based hand sanitizer that contains at least 60% alcohol. Clean your hands often Wash your hands often with soap and water for at least 20 seconds. This is especially important after blowing your nose, coughing, or sneezing; going to the bathroom; and before eating or preparing food. Use hand sanitizer if soap and water are not available. Use an alcohol-based hand sanitizer with at least 60% alcohol, covering all surfaces of your hands and rubbing them together until they feel dry. Soap and water are the best option, especially if hands are visibly dirty. Avoid touching your eyes, nose, and mouth with unwashed hands. Handwashing Tips Avoid sharing personal household items Do not share dishes, drinking glasses, cups, eating utensils, towels, or bedding with other people in your home. Wash these items thoroughly after using them with soap and water or put in the dishwasher. Clean surfaces in your home regularly Clean and disinfect high-touch surfaces (for example, doorknobs, tables, handles, light switches, and countertops) in your "sick room" and bathroom. In shared spaces, you should clean and disinfect surfaces and items after each use by the person who is ill. If you are sick and cannot clean, a caregiver or other person should only clean and disinfect the area around you (such as your bedroom and bathroom) on an as needed basis. Your caregiver/other person should wait as long as possible (at least several hours) and wear a mask before entering, cleaning, and disinfecting shared spaces that you use. Clean and disinfect areas that may have blood,  stool, or body fluids on them. Use household cleaners and disinfectants. Clean visible dirty surfaces with household cleaners containing soap or detergent. Then, use a household disinfectant. Use a product from [employer] List N: Disinfectants for Coronavirus ([id number]). Be sure to follow the instructions on the label to ensure safe and effective use of the product. Many products recommend keeping the surface wet with a disinfectant for a certain period of time (look at "contact time" on the product label). You may also need to wear personal protective equipment, such as gloves, depending on the directions on the product label. Immediately after disinfecting, wash your hands with soap and water for 20 seconds. For completed guidance on cleaning and disinfecting your home, visit Complete Disinfection Guidance. Take steps to improve ventilation  at home Improve ventilation (air flow) at home to help prevent from spreading COVID-19 to other people in your household. Clear out COVID-19 virus particles in the air by opening windows, using air filters, and turning on fans in your home. Use this interactive tool to learn how to improve air flow in your home. When you can be around others after being sick with COVID-19 Deciding when you can be around others is different for different situations. Find out when you can safely end home isolation. For any additional questions about your care, contact your healthcare provider or state or local health department. 12/26/2020 Content source: [facility] for Immunization and Respiratory Diseases ([facility]), Division of Viral Diseases This information is not intended to replace advice given to you by your health care provider. Make sure you discuss any questions you have with your health care provider. Document Revised: 02/08/2021 Document Reviewed: 02/08/2021 Elsevier Patient Education  2022 [employer].      If you have been instructed to have an in-person  evaluation today at a local Urgent Care facility, please use the link below. It will take you to a list of all of our available [facility], including address, phone number and hours of operation. Please do not delay care.  [facility]  If you or a family member do not have a primary care provider, use the link below to schedule a visit and establish care. When you choose a [facility] primary care physician or advanced practice provider, you gain a long-term partner in health. Find a Primary Care Provider  Learn more about [facility] in-office and virtual care options: [facility]

## 2022-07-03 NOTE — Progress Notes (Signed)
Virtual Visit Consent   Jennifer Ponce, you are scheduled for a virtual visit with a [facility] provider today. Just as with appointments in the office, your consent must be obtained to participate. Your consent will be active for this visit and any virtual visit you may have with one of our providers in the next 365 days. If you have a MyChart account, a copy of this consent can be sent to you electronically.  As this is a virtual visit, video technology does not allow for your provider to perform a traditional examination. This may limit your provider's ability to fully assess your condition. If your provider identifies any concerns that need to be evaluated in person or the need to arrange testing (such as labs, EKG, etc.), we will make arrangements to do so. Although advances in technology are sophisticated, we cannot ensure that it will always work on either your end or our end. If the connection with a video visit is poor, the visit may have to be switched to a telephone visit. With either a video or telephone visit, we are not always able to ensure that we have a secure connection.  By engaging in this virtual visit, you consent to the provision of healthcare and authorize for your insurance to be billed (if applicable) for the services provided during this visit. Depending on your insurance coverage, you may receive a charge related to this service.  I need to obtain your verbal consent now. Are you willing to proceed with your visit today? Jennifer Ponce has provided verbal consent on 07/03/2022 for a virtual visit (video or telephone). Leeanne Rio, [place]  Date: 07/03/2022 11:54 AM  Virtual Visit via Video Note   I, Leeanne Rio, connected with  Jennifer Ponce  ([id number], Apr 24, 1981) on 07/03/22 at 11:30 AM EDT by a video-enabled telemedicine application and verified that I am speaking with the correct person using two identifiers.  Location: Patient: Virtual Visit  Location Patient: Home Provider: Virtual Visit Location Provider: Home Office   I discussed the limitations of evaluation and management by telemedicine and the availability of in person appointments. The patient expressed understanding and agreed to proceed.    History of Present Illness: Jennifer Ponce is a 41 y.o. who identifies as a female who was assigned female at birth, and is being seen today for COVID-19. Notes symptoms starting Sun-Monday with milder scratchy throat and cough. Overnight Monday into Tuesday started with fever, chills, aches, fatigue. Increase in congestion and sinus pressure. Tmax 102.1. Denies GI symptoms. Denies chest pain or SOB. Does have history of asthma and is almost out of her rescue inhaler.  Had a positive home test yesterday.   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   Asthma 07/26/2013    Allergies: No Known Allergies Medications:  Current Outpatient Medications:    molnupiravir EUA (LAGEVRIO) 200 mg CAPS capsule, Take 4 capsules (800 mg total) by mouth 2 (two) times daily for 5 days., Disp: 40 capsule, Rfl: 0   albuterol (VENTOLIN HFA) 108 (90 Base) MCG/ACT inhaler, USE 2 INHALATIONS 15 MINUTES APART EVERY 4 HOURS TO RESCUE ASTHMA, Disp: 6.7 each, Rfl: 0   aspirin 81 MG chewable tablet, Chew by mouth., Disp: , Rfl:    cetirizine (ZYRTEC) 10 MG tablet, Take 10 mg by mouth  daily., Disp: , Rfl:    fluticasone (FLONASE) 50 MCG/ACT nasal spray, SPRAY 2 SPRAYS INTO EACH NOSTRIL EVERY DAY, Disp: 48 g, Rfl: 3   ipratropium-albuterol (DUONEB) 0.5-2.5 (3) MG/3ML SOLN, TAKE 3 MLS BY NEBULIZATION EVERY 4 (FOUR) HOURS AS NEEDED. MAX:6 DOSES PER DAY, Disp: 540 mL, Rfl: 2   levonorgestrel (MIRENA) 20 MCG/24HR IUD, 1 each by Intrauterine route once., Disp: , Rfl:    Observations/Objective: Patient is well-developed, well-nourished in no acute distress.  Resting comfortably at home.  Head is normocephalic, atraumatic.  No labored breathing. Speech is clear and coherent with logical content.  Patient is alert and oriented at baseline.   Assessment and Plan: 1. COVID-19 - MyChart COVID-19 home monitoring program; Future - albuterol (VENTOLIN HFA) 108 (90 Base) MCG/ACT inhaler; USE 2 INHALATIONS 15 MINUTES APART EVERY 4 HOURS TO RESCUE ASTHMA  Dispense: 6.7 each; Refill: 0 - fluticasone (FLONASE) 50 MCG/ACT nasal spray; SPRAY 2 SPRAYS INTO EACH NOSTRIL EVERY DAY  Dispense: 48 g; Refill: 3 - molnupiravir EUA (LAGEVRIO) 200 mg CAPS capsule; Take 4 capsules (800 mg total) by mouth 2 (two) times daily for 5 days.  Dispense: 40 capsule; Refill: 0  Patient with multiple risk factors for complicated course of illness. Discussed risks/benefits of antiviral medications including most common potential ADRs. Patient voiced understanding and would like to proceed with antiviral medication. They are candidate for molnupiravir. Rx sent to pharmacy. Supportive measures, OTC medications and vitamin regimen reviewed. Tessalon per orders. Albuterol and Flonase per orders. Patient has been enrolled in a MyChart COVID symptom monitoring program. Samule Dry reviewed in detail. Strict ER precautions discussed with patient.     Follow Up Instructions: I discussed the assessment and treatment plan with the patient. The patient was provided an opportunity to ask questions and all were answered. The patient agreed with the plan and demonstrated an understanding of the instructions.  A copy of instructions were sent to the patient via MyChart unless otherwise noted below.   The patient was advised to call back or seek an in-person evaluation if the symptoms worsen or if the condition fails to improve as anticipated.  Time:  I spent 10 minutes with the patient via telehealth  technology discussing the above problems/concerns.    Leeanne Rio, PA-C

## 2022-09-09 NOTE — Progress Notes (Unsigned)
Assessment and Plan:  There are no diagnoses linked to this encounter.    Further disposition pending results of labs. Discussed med's effects and SE's.   Over 30 minutes of exam, counseling, chart review, and critical decision making was performed.   Future Appointments  Date Time Provider Department Center  09/10/2022  4:00 PM Raynelle Dick, NP [facility] None    ------------------------------------------------------------------------------------------------------------------   HPI There were no vitals taken for this visit. 41 y.o.female presents for  Past Medical History:  Diagnosis Date   Allergy    Anxiety    Bronchitis    hx of   Depression    Heart murmur    born with a heart murmur; pt stated "I havent seen a doctor for it since I was a kid"   History of thyroid nodule 06/23/2015   Pneumonia    hx of   Thyroid nodule      No Known Allergies  Current Outpatient Medications on File Prior to Visit  Medication Sig   albuterol (VENTOLIN HFA) 108 (90 Base) MCG/ACT inhaler USE 2 INHALATIONS 15 MINUTES APART EVERY 4 HOURS TO RESCUE ASTHMA   aspirin 81 MG chewable tablet Chew by mouth.   cetirizine (ZYRTEC) 10 MG tablet Take 10 mg by mouth daily.   fluticasone (FLONASE) 50 MCG/ACT nasal spray SPRAY 2 SPRAYS INTO EACH NOSTRIL EVERY DAY   ipratropium-albuterol (DUONEB) 0.5-2.5 (3) MG/3ML SOLN TAKE 3 MLS BY NEBULIZATION EVERY 4 (FOUR) HOURS AS NEEDED. MAX:6 DOSES PER DAY   levonorgestrel (MIRENA) 20 MCG/24HR IUD 1 each by Intrauterine route once.   No current facility-administered medications on file prior to visit.    ROS: all negative except above.   Physical Exam:  There were no vitals taken for this visit.  General Appearance: Well nourished, in no apparent distress. Eyes: PERRLA, EOMs, conjunctiva no swelling or erythema Sinuses: No Frontal/maxillary tenderness ENT/Mouth: Ext aud canals clear, TMs without erythema, bulging. No erythema, swelling, or  exudate on post pharynx.  Tonsils not swollen or erythematous. Hearing normal.  Neck: Supple, thyroid normal.  Respiratory: Respiratory effort normal, BS equal bilaterally without rales, rhonchi, wheezing or stridor.  Cardio: RRR with no MRGs. Brisk peripheral pulses without edema.  Abdomen: Soft, + BS.  Non tender, no guarding, rebound, hernias, masses. Lymphatics: Non tender without lymphadenopathy.  Musculoskeletal: Full ROM, 5/5 strength, normal gait.  Skin: Warm, dry without rashes, lesions, ecchymosis.  Neuro: Cranial nerves intact. Normal muscle tone, no cerebellar symptoms. Sensation intact.  Psych: Awake and oriented X 3, normal affect, Insight and Judgment appropriate.     Raynelle Dick, NP 3:38 PM [facility]

## 2022-09-10 ENCOUNTER — Ambulatory Visit (INDEPENDENT_AMBULATORY_CARE_PROVIDER_SITE_OTHER): Payer: BC Managed Care – PPO | Admitting: Nurse Practitioner

## 2022-09-10 ENCOUNTER — Other Ambulatory Visit: Payer: Self-pay

## 2022-09-10 ENCOUNTER — Encounter: Payer: Self-pay | Admitting: Nurse Practitioner

## 2022-09-10 VITALS — BP 110/80 | HR 71 | Temp 96.6°F | Ht 67.0 in | Wt 173.0 lb

## 2022-09-10 DIAGNOSIS — R6889 Other general symptoms and signs: Secondary | ICD-10-CM

## 2022-09-10 DIAGNOSIS — J4521 Mild intermittent asthma with (acute) exacerbation: Secondary | ICD-10-CM | POA: Diagnosis not present

## 2022-09-10 DIAGNOSIS — Z1152 Encounter for screening for COVID-19: Secondary | ICD-10-CM | POA: Diagnosis not present

## 2022-09-10 DIAGNOSIS — U071 COVID-19: Secondary | ICD-10-CM

## 2022-09-10 LAB — POCT INFLUENZA A/B
Influenza A, POC: NEGATIVE
Influenza B, POC: NEGATIVE

## 2022-09-10 LAB — POC COVID19 BINAXNOW: SARS Coronavirus 2 Ag: NEGATIVE

## 2022-09-10 MED ORDER — IPRATROPIUM-ALBUTEROL 0.5-2.5 (3) MG/3ML IN SOLN: 3.0000 mL | RESPIRATORY_TRACT | 2 refills | Status: AC | PRN

## 2022-09-10 MED ORDER — DEXAMETHASONE 4 MG PO TABS: ORAL_TABLET | ORAL | 0 refills | Status: DC

## 2022-09-10 MED ORDER — ALBUTEROL SULFATE HFA 108 (90 BASE) MCG/ACT IN AERS: INHALATION_SPRAY | RESPIRATORY_TRACT | 0 refills | Status: DC

## 2022-09-10 MED ORDER — MONTELUKAST SODIUM 10 MG PO TABS: 10.0000 mg | ORAL_TABLET | Freq: Every day | ORAL | 2 refills | Status: DC

## 2022-09-10 MED ORDER — YUPELRI 175 MCG/3ML IN SOLN: 175.0000 ug | Freq: Every day | RESPIRATORY_TRACT | 3 refills | Status: DC

## 2022-09-10 NOTE — Patient Instructions (Signed)
Asthma, Adult  Asthma is a condition that causes swelling and narrowing of the airways. These are the passages that lead from the nose and mouth down into the lungs. When asthma symptoms get worse it is called an asthma attack or flare. This can make it hard to breathe. Asthma flares can range from minor to life-threatening. There is no cure for asthma, but medicines and lifestyle changes can help to control it. What are the causes? It is not known exactly what causes asthma, but certain things can cause asthma symptoms to get worse (triggers). What can trigger an asthma attack? Cigarette smoke. Mold. Dust. Your pet's skin flakes (dander). Cockroaches. Pollen. Air pollution (like household cleaners, wood smoke, smog, or chemical odors). What are the signs or symptoms? Trouble breathing (shortness of breath). Coughing. Making high-pitched whistling sounds when you breathe, most often when you breathe out (wheezing). Chest tightness. Tiredness with little activity. Poor exercise tolerance. How is this treated? Controller medicines that help prevent asthma symptoms. Fast-acting reliever or rescue medicines. These give short-term relief of asthma symptoms. Allergy medicines if your attacks are brought on by allergens. Medicines to help control the body's defense (immune) system. Staying away from the things that cause asthma attacks. Follow these instructions at home: Avoiding triggers in your home Do not allow anyone to smoke in your home. Limit use of fireplaces and wood stoves. Get rid of pests (such as roaches and mice) and their droppings. Keep your home clean. Clean your floors. Dust regularly. Use cleaning products that do not smell. Wash bed sheets and blankets every week in hot water. Dry them in a dryer. Have someone vacuum when you are not home. Change your heating and air conditioning filters often. Use blankets that are made of polyester or cotton. General  instructions Take over-the-counter and prescription medicines only as told by your doctor. Do not smoke or use any products that contain nicotine or tobacco. If you need help quitting, ask your doctor. Stay away from secondhand smoke. Avoid doing things outdoors when allergen counts are high and when air quality is low. Warm up before you exercise. Take time to cool down after exercise. Use a peak flow meter as told by your doctor. A peak flow meter is a tool that measures how well your lungs are working. Keep track of the peak flow meter's readings. Write them down. Follow your asthma action plan. This is a written plan for taking care of your asthma and treating your attacks. Make sure you get all the shots (vaccines) that your doctor recommends. Ask your doctor about a flu shot and a pneumonia shot. Keep all follow-up visits. Contact a doctor if: You have wheezing, shortness of breath, or a cough even while taking medicine to prevent attacks. The mucus you cough up (sputum) is thicker than usual. The mucus you cough up changes from clear or white to yellow, green, gray, or is bloody. You have problems from the medicine you are taking, such as: A rash. Itching. Swelling. Trouble breathing. You need reliever medicines more than 2-3 times a week. Your peak flow reading is still at 50-79% of your personal best after following the action plan for 1 hour. You have a fever. Get help right away if: You seem to be worse and are not responding to medicine during an asthma attack. You are short of breath even at rest. You get short of breath when doing very little activity. You have trouble eating, drinking, or talking. You have chest   pain or tightness. You have a fast heartbeat. Your lips or fingernails start to turn blue. You are light-headed or dizzy, or you faint. Your peak flow is less than 50% of your personal best. You feel too tired to breathe normally. These symptoms may be an  emergency. Get help right away. Call 911. Do not wait to see if the symptoms will go away. Do not drive yourself to the hospital. Summary Asthma is a long-term (chronic) condition in which the airways get tight and narrow. An asthma attack can make it hard to breathe. Asthma cannot be cured, but medicines and lifestyle changes can help control it. Make sure you understand how to avoid triggers and how and when to use your medicines. Avoid things that can cause allergy symptoms (allergens). These include animal skin flakes (dander) and pollen from trees or grass. Avoid things that pollute the air. These may include household cleaners, wood smoke, smog, or chemical odors. This information is not intended to replace advice given to you by your health care provider. Make sure you discuss any questions you have with your health care provider. Document Revised: 07/02/2021 Document Reviewed: 07/02/2021 Elsevier Patient Education  2023 [employer] Inc.  

## 2022-09-18 ENCOUNTER — Encounter: Payer: Self-pay | Admitting: Nurse Practitioner

## 2022-09-19 ENCOUNTER — Encounter: Payer: Self-pay | Admitting: Nurse Practitioner

## 2022-09-19 ENCOUNTER — Ambulatory Visit: Payer: BC Managed Care – PPO | Admitting: Nurse Practitioner

## 2022-09-19 VITALS — HR 73 | Temp 97.9°F | Wt 169.0 lb

## 2022-09-19 DIAGNOSIS — R051 Acute cough: Secondary | ICD-10-CM | POA: Diagnosis not present

## 2022-09-19 DIAGNOSIS — J4521 Mild intermittent asthma with (acute) exacerbation: Secondary | ICD-10-CM | POA: Diagnosis not present

## 2022-09-19 DIAGNOSIS — R6889 Other general symptoms and signs: Secondary | ICD-10-CM | POA: Diagnosis not present

## 2022-09-19 MED ORDER — PROMETHAZINE-DM 6.25-15 MG/5ML PO SYRP: 5.0000 mL | ORAL_SOLUTION | Freq: Four times a day (QID) | ORAL | 0 refills | Status: DC | PRN

## 2022-09-19 MED ORDER — DEXAMETHASONE 4 MG PO TABS: ORAL_TABLET | ORAL | 0 refills | Status: DC

## 2022-09-19 NOTE — Telephone Encounter (Signed)
Please schedule a phone visit for flu

## 2022-09-19 NOTE — Progress Notes (Signed)
Virtual telephone visit   Virtual Visit via Telephone Note   This visit type was conducted due to national recommendations for restrictions regarding the COVID-19 Pandemic (e.g. social distancing) in an effort to limit this patient's exposure and mitigate transmission in our community. Due to her co-morbid illnesses, this patient is at least at moderate risk for complications without adequate follow up. This format is felt to be most appropriate for this patient at this time. The patient did not have access to video technology or had technical difficulties with video requiring transitioning to audio format only (telephone). Physical exam was limited to content and character of the telephone converstion.    Patient location: Home Provider location: Office    Patient: Jennifer Ponce   DOB: 23-Nov-1980   41 y.o. Female  MRN: [id number] Visit Date: 09/19/2022  Today's Provider: Adela Glimpse, NP  Subjective:    Chief Complaint  Patient presents with   Influenza   HPI Patient states that she tested positive for the Flu today.  Was seen last 09/10/22 and tested negative.  Patient thought she was having an asthma attack and was treated with dexamethasone taper and Singulair with a referral to pulmonology.  She was also instructed to continue inhalers.  Exposure includes her son that also tested positive.  She is continuing to use nebulizer and inhalers with improvement.  Her  main concern is cough which is keeping her awake at night.    Medications: Outpatient Medications Prior to Visit  Medication Sig   albuterol (VENTOLIN HFA) 108 (90 Base) MCG/ACT inhaler USE 2 INHALATIONS 15 MINUTES APART EVERY 4 HOURS TO RESCUE ASTHMA   aspirin 81 MG chewable tablet Chew by mouth.   cetirizine (ZYRTEC) 10 MG tablet Take 10 mg by mouth daily.   fluticasone (FLONASE) 50 MCG/ACT nasal spray SPRAY 2 SPRAYS INTO EACH NOSTRIL EVERY DAY   ipratropium-albuterol (DUONEB) 0.5-2.5 (3) MG/3ML SOLN Take 3 mLs by  nebulization every 4 (four) hours as needed. Max:6 doses per day   levonorgestrel (MIRENA) 20 MCG/24HR IUD 1 each by Intrauterine route once.   montelukast (SINGULAIR) 10 MG tablet Take 1 tablet (10 mg total) by mouth daily.   revefenacin (YUPELRI) 175 MCG/3ML nebulizer solution Take 3 mLs (175 mcg total) by nebulization daily.   [DISCONTINUED] dexamethasone (DECADRON) 4 MG tablet Take 3 tabs for 3 days, 2 tabs for 3 days 1 tab for 5 days. Take with food.   No facility-administered medications prior to visit.   Objective:    Pulse 73   Temp 97.9 F (36.6 C)   Wt 169 lb (76.7 kg)   SpO2 97%   BMI 26.47 kg/m     Assessment & Plan:      1. Flu-like symptoms  - dexamethasone (DECADRON) 4 MG tablet; Take 3 tabs for 3 days, 2 tabs for 3 days 1 tab for 5 days. Take with food.  Dispense: 20 tablet; Refill: 0 - promethazine-dextromethorphan (PROMETHAZINE-DM) 6.25-15 MG/5ML syrup; Take 5 mLs by mouth 4 (four) times daily as needed for cough.  Dispense: 240 mL; Refill: 0  2. Intermittent asthma with acute exacerbation, unspecified asthma severity Continue inhalers and nebulizer as directed Continue antihistamine and Singulair Will provide another round of steroids  Report to ER or call 911 for any increase in difficulty breathing  - dexamethasone (DECADRON) 4 MG tablet; Take 3 tabs for 3 days, 2 tabs for 3 days 1 tab for 5 days. Take with food.  Dispense: 20 tablet; Refill: 0  3. Acute cough Start Promethazine cough syrup Stay well hydrated    I discussed the assessment and treatment plan with the patient. The patient was provided an opportunity to ask questions and all were answered. The patient agreed with the plan and demonstrated an understanding of the instructions.   The patient was advised to call back or seek an in-person evaluation if the symptoms worsen or if the condition fails to improve as anticipated.  I provided 15 minutes of non-face-to-face time during this  encounter.   Adela Glimpse, NP  [facility] [phone] (phone) [phone] (fax)

## 2022-10-14 ENCOUNTER — Other Ambulatory Visit: Payer: Self-pay

## 2022-10-14 DIAGNOSIS — U071 COVID-19: Secondary | ICD-10-CM

## 2022-10-14 DIAGNOSIS — J4521 Mild intermittent asthma with (acute) exacerbation: Secondary | ICD-10-CM

## 2022-10-14 MED ORDER — ALBUTEROL SULFATE HFA 108 (90 BASE) MCG/ACT IN AERS: INHALATION_SPRAY | RESPIRATORY_TRACT | 2 refills | Status: DC

## 2022-10-14 MED ORDER — MONTELUKAST SODIUM 10 MG PO TABS: 10.0000 mg | ORAL_TABLET | Freq: Every day | ORAL | 2 refills | Status: DC

## 2022-10-16 ENCOUNTER — Encounter: Payer: Self-pay | Admitting: Internal Medicine

## 2022-10-16 ENCOUNTER — Ambulatory Visit (INDEPENDENT_AMBULATORY_CARE_PROVIDER_SITE_OTHER): Payer: BC Managed Care – PPO

## 2022-10-16 ENCOUNTER — Ambulatory Visit (INDEPENDENT_AMBULATORY_CARE_PROVIDER_SITE_OTHER): Payer: BC Managed Care – PPO | Admitting: Internal Medicine

## 2022-10-16 ENCOUNTER — Other Ambulatory Visit (HOSPITAL_COMMUNITY): Payer: Self-pay

## 2022-10-16 DIAGNOSIS — J45909 Unspecified asthma, uncomplicated: Secondary | ICD-10-CM | POA: Diagnosis not present

## 2022-10-16 DIAGNOSIS — R0602 Shortness of breath: Secondary | ICD-10-CM | POA: Diagnosis not present

## 2022-10-16 LAB — CBC WITH DIFFERENTIAL/PLATELET
Basophils Absolute: 0 10*3/uL (ref 0.0–0.1)
Basophils Relative: 0.6 % (ref 0.0–3.0)
Eosinophils Absolute: 0.3 10*3/uL (ref 0.0–0.7)
Eosinophils Relative: 4.1 % (ref 0.0–5.0)
HCT: 39.9 % (ref 36.0–46.0)
Hemoglobin: 13.4 g/dL (ref 12.0–15.0)
Lymphocytes Relative: 32 % (ref 12.0–46.0)
Lymphs Abs: 2 10*3/uL (ref 0.7–4.0)
MCHC: 33.6 g/dL (ref 30.0–36.0)
MCV: 92.9 fl (ref 78.0–100.0)
Monocytes Absolute: 0.6 10*3/uL (ref 0.1–1.0)
Monocytes Relative: 8.9 % (ref 3.0–12.0)
Neutro Abs: 3.5 10*3/uL (ref 1.4–7.7)
Neutrophils Relative %: 54.4 % (ref 43.0–77.0)
Platelets: 322 10*3/uL (ref 150.0–400.0)
RBC: 4.3 Mil/uL (ref 3.87–5.11)
RDW: 13.6 % (ref 11.5–15.5)
WBC: 6.4 10*3/uL (ref 4.0–10.5)

## 2022-10-16 LAB — POCT EXHALED NITRIC OXIDE: FeNO level (ppb): 223

## 2022-10-16 MED ORDER — BUDESONIDE-FORMOTEROL FUMARATE 80-4.5 MCG/ACT IN AERO: INHALATION_SPRAY | RESPIRATORY_TRACT | 12 refills | Status: DC

## 2022-10-16 NOTE — Telephone Encounter (Signed)
Per test claim PA not needed, refill too soon until 11/07/2022 due to fill being detected on todays date (10/16/2022)

## 2022-10-16 NOTE — Progress Notes (Unsigned)
Jennifer Ponce, female    DOB: [birth year]   MRN: [id number]   Brief patient profile:  41  quit smoking 2008 with with seasonal rhinitis/sinus infections/pna starting in her 74s  initially rx zyrtec then around 2014  more wheeze/ sob dx as asthma by Dr Maple Hudson and  referred back to pulmonary clinic 10/16/2022 by Oneta Rack for asthma previously on Breo.      History of Present Illness  10/16/2022  Pulmonary/ 1st office eval/Harlow Basley only maint rx =  singulair with doe x years  Chief Complaint  Patient presents with   Consult    Asthma has gotten worse x 1 year.  Cough, wheeze and SOB flares more frequent.  ACT is 15  Dyspnea:  fast walk several hundred feet esp heat  Cough: none/some nasal congestion  Sleep: maybe once a week wakes up and uses albuterol/ neb when sick  SABA use: last used one week prior to OV    No obvious day to day or daytime pattern/variability or assoc excess/ purulent sputum or mucus plugs or hemoptysis or cp or chest tightness, subjective wheeze or overt  hb symptoms.    Also denies any obvious fluctuation of symptoms with weather or environmental changes or other aggravating or alleviating factors except as outlined above   No unusual exposure hx or h/o childhood pna/ asthma or knowledge of premature birth.  Current Allergies, Complete Past Medical History, Past Surgical History, Family History, and Social History were reviewed in [employer] record.  ROS  The following are not active complaints unless bolded Hoarseness, sore throat, dysphagia, dental problems, itching, sneezing,  nasal congestion or discharge of excess mucus or purulent secretions, ear ache,   fever, chills, sweats, unintended wt loss or wt gain, classically pleuritic or exertional cp,  orthopnea pnd or arm/hand swelling  or leg swelling, presyncope, palpitations, abdominal pain, anorexia, nausea, vomiting, diarrhea  or change in bowel habits or change in bladder habits, change  in stools or change in urine, dysuria, hematuria,  rash, arthralgias, visual complaints, headache, numbness, weakness or ataxia or problems with walking or coordination,  change in mood or  memory.            Past Medical History:  Diagnosis Date   Allergy    Anxiety    Bronchitis    hx of   Depression    Heart murmur    born with a heart murmur; pt stated "I havent seen a doctor for it since I was a kid"   History of thyroid nodule 06/23/2015   Pneumonia    hx of   Thyroid nodule     Outpatient Medications Prior to Visit  Medication Sig Dispense Refill   albuterol (VENTOLIN HFA) 108 (90 Base) MCG/ACT inhaler USE 2 INHALATIONS 15 MINUTES APART EVERY 4 HOURS TO RESCUE ASTHMA 6.7 each 2   cetirizine (ZYRTEC) 10 MG tablet Take 10 mg by mouth daily.     fluticasone (FLONASE) 50 MCG/ACT nasal spray SPRAY 2 SPRAYS INTO EACH NOSTRIL EVERY DAY 48 g 3   ipratropium-albuterol (DUONEB) 0.5-2.5 (3) MG/3ML SOLN Take 3 mLs by nebulization every 4 (four) hours as needed. Max:6 doses per day 540 mL 2   levonorgestrel (MIRENA) 20 MCG/24HR IUD 1 each by Intrauterine route once.     montelukast (SINGULAIR) 10 MG tablet Take 1 tablet (10 mg total) by mouth daily. 30 tablet 2              0  0       3       Objective:     BP (!) 100/58 (BP Location: Left Arm, Patient Position: Sitting, Cuff Size: Normal)   Pulse 79   Temp 98.5 F (36.9 C) (Oral)   Ht 5\' 7"  (1.702 m)   Wt 174 lb 3.2 oz (79 kg)   SpO2 97%   BMI 27.28 kg/m   SpO2: 97 %  Pleasant amb wf nad    HEENT : Oropharynx  clear         NECK :  without  apparent JVD/ palpable Nodes/TM    LUNGS: no acc muscle use,  Nl contour chest which is clear to A and P bilaterally without cough on insp or exp maneuvers   CV:  RRR  no s3 or murmur or increase in P2, and no edema   ABD:  soft and nontender with nl inspiratory excursion in the supine position. No bruits or organomegaly appreciated   MS:  Nl gait/ ext warm without  deformities Or obvious joint restrictions  calf tenderness, cyanosis or clubbing    SKIN: warm and dry without lesions    NEURO:  alert, approp, nl sensorium with  no motor or cerebellar deficits apparent.       Assessment   Outpatient Encounter Medications as of 10/16/2022  Medication Sig   albuterol (VENTOLIN HFA) 108 (90 Base) MCG/ACT inhaler USE 2 INHALATIONS 15 MINUTES APART EVERY 4 HOURS TO RESCUE ASTHMA   cetirizine (ZYRTEC) 10 MG tablet Take 10 mg by mouth daily.   fluticasone (FLONASE) 50 MCG/ACT nasal spray SPRAY 2 SPRAYS INTO EACH NOSTRIL EVERY DAY   ipratropium-albuterol (DUONEB) 0.5-2.5 (3) MG/3ML SOLN Take 3 mLs by nebulization every 4 (four) hours as needed. Max:6 doses per day   levonorgestrel (MIRENA) 20 MCG/24HR IUD 1 each by Intrauterine route once.   montelukast (SINGULAIR) 10 MG tablet Take 1 tablet (10 mg total) by mouth daily.                    Asthma Onset ? 2014 in setting of seasonal rhinitis since childhood/ quit smoking 2008 PFT: 08/06/2013-within normal limits s prior rx:  FEV1 4.03/121%, FEV1/FVC 0.85, 7% better p saba - 10/16/2022 rx symbicort 80 2bid  x better for a week then taper to 2 each am  - FENO  10/16/2022   = 223  - Allergy screen 10/16/2022 >  Eos 0.3/  IgE  pending   Concerned about over reliance of saba both hfa and neb historically though no evidence of tachyphylaxis or copd to this point and likely significant underlying allergic component (mostly rhinitis historically though) so rec trial of symbicort 80 2bid until better then taper to 2 each am if doing great and f/u in 6 weeks with all meds in hand using a trust but verify approach to confirm accurate Medication  Reconciliation The principal here is that until we are certain that the  patients are doing what we've asked, it makes no sense to ask them to do more.    Each maintenance medication was reviewed in detail including emphasizing most importantly the difference between  maintenance and prns and under what circumstances the prns are to be triggered using an action plan format where appropriate.  Total time for H and P, chart review, counseling, reviewing hfa device(s) and generating customized AVS unique to this office visit / same day charting = 45 min with pt new to me  Christinia Gully, MD 10/16/2022

## 2022-10-16 NOTE — Assessment & Plan Note (Addendum)
Onset ? 2014 in setting of seasonal rhinitis since childhood/ quit smoking 2008 PFT: 08/06/2013-within normal limits s prior rx:  FEV1 4.03/121%, FEV1/FVC 0.85, 7% better p saba - 10/16/2022 rx symbicort 80 2bid  x better for a week then taper to 2 each am  - FENO  10/16/2022   = 223  - Allergy screen 10/16/2022 >  Eos 0.3/  IgE  pending   Concerned about over reliance of saba both hfa and neb historically though no evidence of tachyphylaxis or copd to this point and likely significant underlying allergic component (mostly rhinitis historically though) so rec trial of symbicort 80 2bid until better then taper to 2 each am if doing great and f/u in 6 weeks with all meds in hand using a trust but verify approach to confirm accurate Medication  Reconciliation The principal here is that until we are certain that the  patients are doing what we've asked, it makes no sense to ask them to do more.    Each maintenance medication was reviewed in detail including emphasizing most importantly the difference between maintenance and prns and under what circumstances the prns are to be triggered using an action plan format where appropriate.  Total time for H and P, chart review, counseling, reviewing hfa device(s) and generating customized AVS unique to this office visit / same day charting = 45 min with pt new to me

## 2022-10-16 NOTE — Telephone Encounter (Signed)
PA team, please advise on Symbicort PA. thanks

## 2022-10-16 NOTE — Patient Instructions (Signed)
Plan A = Automatic = Always=    Symbicort 80 (or Dulera 100) Take 2 puffs first thing in am and then another 2 puffs about 12 hours later.    Work on inhaler technique:  relax and gently blow all the way out then take a nice smooth full deep breath back in, triggering the inhaler at same time you start breathing in.  Hold breath in for at least  5 seconds if you can. Blow out symbicort  thru nose. Rinse and gargle with water when done.  If mouth or throat bother you at all,  try brushing teeth/gums/tongue with arm and hammer toothpaste/ make a slurry and gargle and spit out.      Plan B = Backup (to supplement plan A, not to replace it) Only use your albuterol inhaler as a rescue medication to be used if you can't catch your breath by resting or doing a relaxed purse lip breathing pattern.  - The less you use it, the better it will work when you need it. - Ok to use the inhaler up to 2 puffs  every 4 hours if you must but call for appointment if use goes up over your usual need - Don't leave home without it !!  (think of it like the spare tire for your car)   Plan C = Crisis (instead of Plan B but only if Plan B stops working) - only use your albuterol nebulizer if you first try Plan B and it fails to help > ok to use the nebulizer up to every 4 hours but if start needing it regularly call for immediate appointment  Please remember to go to the lab department   for your tests - we will call you with the results when they are available.  Please schedule a follow up visit in 3 months but call sooner if needed  with all medications /inhalers/ solutions in hand so we can verify exactly what you are taking. This includes all medications from all doctors and over the counters

## 2022-10-17 ENCOUNTER — Encounter: Payer: Self-pay | Admitting: Internal Medicine

## 2022-10-17 ENCOUNTER — Other Ambulatory Visit (HOSPITAL_COMMUNITY): Payer: Self-pay

## 2022-10-17 LAB — IGE: IgE (Immunoglobulin E), Serum: 166 kU/L — ABNORMAL HIGH (ref <=114)

## 2022-10-21 ENCOUNTER — Telehealth: Payer: Self-pay

## 2022-10-21 NOTE — Telephone Encounter (Signed)
-----  Message from Tanda Rockers, MD sent at 10/17/2022  4:15 PM EST ----- Call patient :  Studies are c/w just mild allergy signal so no need to eval further if respond appropriately to rx as already outlined

## 2022-10-21 NOTE — Telephone Encounter (Signed)
Called Pt to give results of blood work. Pt stated understanding and nothing further needed. 

## 2022-11-06 ENCOUNTER — Encounter: Payer: BC Managed Care – PPO | Admitting: Adult Health

## 2022-11-28 NOTE — Progress Notes (Signed)
Assessment and Plan:  Jennifer Ponce was seen today for depression.  Diagnoses and all orders for this visit:  Episode of recurrent major depressive disorder, unspecified depression episode severity ([place]) Start Prozac 20 mg qd Follow up in 2 months or sooner if any problems or meds are not effective Lifestyle discussed: diet/exerise, sleep hygiene, stress management, hydration  -     FLUoxetine (PROZAC) 20 MG capsule; Take 1 capsule (20 mg total) by mouth daily.  Insomnia, unspecified type Practice good sleep hygiene and take atarax as needed -     hydrOXYzine (ATARAX) 25 MG tablet; Take 2 tablets (50 mg total) by mouth 3 (three) times daily as needed for itching.       Further disposition pending results of labs. Discussed med's effects and SE's.   Over 30 minutes of exam, counseling, chart review, and critical decision making was performed.   Future Appointments  Date Time Provider [place]  12/13/2022  9:00 AM Jennifer Rockers, MD [id number] None    ------------------------------------------------------------------------------------------------------------------   HPI BP 110/68   Pulse 71   Temp 97.7 F (36.5 C)   Ht '5\' 7"'$  (1.702 m)   Wt 175 lb (79.4 kg)   SpO2 99%   BMI 27.41 kg/m   42 y.o.female presents for depression and feeling low.  Her father is going down hill and she just got home from seeing him in the ICU.  She is feeling down. Low, exhausted, crying frequently and easily. She has used Prozac in the past.   She does have difficulty falling asleep and staying asleep.   Has been seen by Dr. Melvyn Ponce and is started on Symbicort and is to return in 2 weeks for PFT's.  BMI is Body mass index is 27.41 kg/m., she has not been working on diet and exercise. Wt Readings from Last 3 Encounters:  11/29/22 175 lb (79.4 kg)  10/16/22 174 lb 3.2 oz (79 kg)  09/19/22 169 lb (76.7 kg)   BP Readings from Last 3 Encounters:  11/29/22 110/68  10/16/22 (!) 100/58   09/10/22 110/80    Past Medical History:  Diagnosis Date   Allergy    Anxiety    Bronchitis    hx of   Depression    Heart murmur    born with a heart murmur; pt stated "I havent seen a doctor for it since I was a kid"   History of thyroid nodule 06/23/2015   Pneumonia    hx of   Thyroid nodule      No Known Allergies  Current Outpatient Medications on File Prior to Visit  Medication Sig   albuterol (VENTOLIN HFA) 108 (90 Base) MCG/ACT inhaler USE 2 INHALATIONS 15 MINUTES APART EVERY 4 HOURS TO RESCUE ASTHMA   budesonide-formoterol (SYMBICORT) 80-4.5 MCG/ACT inhaler Take 2 puffs first thing in am and then another 2 puffs about 12 hours later.   cetirizine (ZYRTEC) 10 MG tablet Take 10 mg by mouth daily.   fluticasone (FLONASE) 50 MCG/ACT nasal spray SPRAY 2 SPRAYS INTO EACH NOSTRIL EVERY DAY   ipratropium-albuterol (DUONEB) 0.5-2.5 (3) MG/3ML SOLN Take 3 mLs by nebulization every 4 (four) hours as needed. Max:6 doses per day   levonorgestrel (MIRENA) 20 MCG/24HR IUD 1 each by Intrauterine route once.   montelukast (SINGULAIR) 10 MG tablet Take 1 tablet (10 mg total) by mouth daily.   No current facility-administered medications on file prior to visit.    ROS: all negative except above.   Physical Exam:  BP 110/68   Pulse 71   Temp 97.7 F (36.5 C)   Ht '5\' 7"'$  (1.702 m)   Wt 175 lb (79.4 kg)   SpO2 99%   BMI 27.41 kg/m   General Appearance: Well nourished, teary and appears depressed. Eyes: PERRLA, EOMs, conjunctiva no swelling or erythema Sinuses: No Frontal/maxillary tenderness ENT/Mouth: Ext aud canals clear, TMs without erythema, bulging. No erythema, swelling, or exudate on post pharynx.  Tonsils not swollen or erythematous. Hearing normal.  Neck: Supple, thyroid normal.  Respiratory: Respiratory effort normal, BS equal bilaterally without rales, rhonchi, wheezing or stridor.  Cardio: RRR with no MRGs. Brisk peripheral pulses without edema.  Lymphatics: Non  tender without lymphadenopathy.  Musculoskeletal: Full ROM, 5/5 strength, normal gait.  Skin: Warm, dry without rashes, lesions, ecchymosis.  Neuro: Cranial nerves intact. Normal muscle tone, no cerebellar symptoms. Sensation intact.  Psych: Awake and oriented X 3, depressed affect, Insight and Judgment appropriate.     Jennifer Rossetti, NP 10:10 AM [facility]

## 2022-11-29 ENCOUNTER — Ambulatory Visit (INDEPENDENT_AMBULATORY_CARE_PROVIDER_SITE_OTHER): Payer: BC Managed Care – PPO | Admitting: Nurse Practitioner

## 2022-11-29 ENCOUNTER — Encounter: Payer: Self-pay | Admitting: Nurse Practitioner

## 2022-11-29 ENCOUNTER — Ambulatory Visit: Payer: BC Managed Care – PPO | Admitting: Internal Medicine

## 2022-11-29 VITALS — BP 110/68 | HR 71 | Temp 97.7°F | Ht 67.0 in | Wt 175.0 lb

## 2022-11-29 DIAGNOSIS — G4700 Insomnia, unspecified: Secondary | ICD-10-CM | POA: Diagnosis not present

## 2022-11-29 DIAGNOSIS — F339 Major depressive disorder, recurrent, unspecified: Secondary | ICD-10-CM | POA: Diagnosis not present

## 2022-11-29 MED ORDER — FLUOXETINE HCL 20 MG PO CAPS: 20.0000 mg | ORAL_CAPSULE | Freq: Every day | ORAL | 2 refills | Status: DC

## 2022-11-29 MED ORDER — HYDROXYZINE HCL 25 MG PO TABS: 50.0000 mg | ORAL_TABLET | Freq: Three times a day (TID) | ORAL | 0 refills | Status: DC | PRN

## 2022-11-29 NOTE — Patient Instructions (Signed)

## 2022-12-13 ENCOUNTER — Ambulatory Visit (INDEPENDENT_AMBULATORY_CARE_PROVIDER_SITE_OTHER): Payer: BC Managed Care – PPO | Admitting: Internal Medicine

## 2022-12-13 ENCOUNTER — Encounter: Payer: Self-pay | Admitting: Internal Medicine

## 2022-12-13 VITALS — BP 110/78 | HR 68 | Temp 98.3°F | Ht 67.0 in | Wt 174.2 lb

## 2022-12-13 DIAGNOSIS — J45909 Unspecified asthma, uncomplicated: Secondary | ICD-10-CM | POA: Diagnosis not present

## 2022-12-13 LAB — NITRIC OXIDE: Nitric Oxide: 51

## 2022-12-13 NOTE — Patient Instructions (Addendum)
Also  Ok to try albuterol 15 min before an activity (on alternating days)  that you know would usually make you short of breath and see if it makes any difference and if makes none then don't take albuterol after activity unless you can't catch your breath as this means it's the resting that helps, not the albuterol.  Work on inhaler technique:  relax and gently blow all the way out then take a nice smooth full deep breath back in, triggering the inhaler at same time you start breathing in.  Hold breath in for at least  5 seconds if you can. Blow out symbicort  thru nose. Rinse and gargle with water when done.  If mouth or throat bother you at all,  try brushing teeth/gums/tongue with arm and hammer toothpaste/ make a slurry and gargle and spit out.   Please schedule a follow up visit in 6 months but call sooner if needed

## 2022-12-13 NOTE — Assessment & Plan Note (Addendum)
Onset ? 2014 in setting of seasonal rhinitis since childhood/ quit smoking 2008 PFT: 08/06/2013-within normal limits s prior rx:  FEV1 4.03/121%, FEV1/FVC 0.85, 7% better p saba - 10/16/2022 rx symbicort 80 2bid  x better for a week then taper to 2 each am  - FENO  10/16/2022   = 223  - Allergy screen 10/16/2022 >  Eos 0. 3/  IgE  p166  - FENO  12/13/2022  = 51 on symb 80 2bid/singulair with sense of pnds and doe x jogging only  - 12/13/2022  After extensive coaching inhaler device,  effectiveness =    80%   All goals of chronic asthma control met including optimal function and elimination of symptoms with minimal need for rescue therapy.  Contingencies discussed in full including contacting this office immediately if not controlling the symptoms using the rule of two's.     The jogging doe is likely conditioning but to tell the difference between this and EIA:  Re SABA :  I spent extra time with pt today reviewing appropriate use of albuterol for prn use on exertion with the following points: 1) saba is for relief of sob that does not improve by walking a slower pace or resting but rather if the pt does not improve after trying this first. 2) If the pt is convinced, as many are, that saba helps recover from activity faster then it's easy to tell if this is the case by re-challenging : ie stop, take the inhaler, then p 5 minutes try the exact same activity (intensity of workload) that just caused the symptoms and see if they are substantially diminished or not after saba 3) if there is an activity that reproducibly causes the symptoms, try the saba 15 min before the activity on alternate days   If in fact the saba really does help, then fine to continue to use it prn but advised may need to look closer at the maintenance regimen being used to achieve better control of airways disease with exertion.   F/u q 6 m, sooner prn    Each maintenance medication was reviewed in detail including emphasizing  most importantly the difference between maintenance and prns and under what circumstances the prns are to be triggered using an action plan format where appropriate.  Total time for H and P, chart review, counseling, reviewing hfa device(s) and generating customized AVS unique to this office visit / same day charting = 25 min

## 2022-12-13 NOTE — Progress Notes (Signed)
Jennifer Ponce, female    DOB: 1981-07-28   MRN: [id number]   Brief patient profile:  89  quit smoking 2008 with with seasonal rhinitis/sinus infections/pna starting in her 16s  initially rx zyrtec then around 2014  more wheeze/ sob dx as asthma by Dr Annamaria Boots and  referred back to pulmonary clinic 10/16/2022 by Melford Aase for asthma previously on Breo.      History of Present Illness  10/16/2022  Pulmonary/ 1st office eval/Jennifer Ponce only maint rx =  singulair with doe x years  Chief Complaint  Patient presents with   Consult    Asthma has gotten worse x 1 year.  Cough, wheeze and SOB flares more frequent.  ACT is 15  Dyspnea:  fast walk several hundred feet esp heat  Cough: none/some nasal congestion  Sleep: maybe once a week wakes up and uses albuterol/ neb when sick  SABA use: last used one week prior to OV  Rec Plan A = Automatic = Always=    Symbicort 80 (or Dulera 100) Take 2 puffs first thing in am and then another 2 puffs about 12 hours later.  Work on inhaler technique:   Plan B = Backup (to supplement plan A, not to replace it) Only use your albuterol inhaler as a rescue medication  Plan C = Crisis (instead of Plan B but only if Plan B stops working) - only use your albuterol nebulizer if you first try Plan B  Labs Allergy screen 10/16/2022 >  Eos 0. 3/  IgE  p166   Please schedule a follow up visit in 3 months but call sooner if needed  with all medications /inhalers/ solutions in hand     12/13/2022  f/u ov/Jennifer Ponce/ [facility] re: asthma  maint on symbicort 80 /singulair  Chief Complaint  Patient presents with   Follow-up    SOB with exertion. No wheezing or cough.   Dyspnea:  only with jogging  Cough: sense of pnds Sleeping: no resp cc  SABA use: only p exercise  02: none      No obvious day to day or daytime variability or assoc excess/ purulent sputum or mucus plugs or hemoptysis or cp or chest tightness, subjective wheeze or overt sinus or hb symptoms.    Sleeping now  without nocturnal  or early am exacerbation  of respiratory  c/o's or need for noct saba. Also denies any obvious fluctuation of symptoms with weather or environmental changes or other aggravating or alleviating factors except as outlined above   No unusual exposure hx or h/o childhood pna/ asthma or knowledge of premature birth.  Current Allergies, Complete Past Medical History, Past Surgical History, Family History, and Social History were reviewed in [employer] record.  ROS  The following are not active complaints unless bolded Hoarseness, sore throat, dysphagia, dental problems, itching, sneezing,  nasal congestion or discharge of excess mucus or purulent secretions, ear ache,   fever, chills, sweats, unintended wt loss or wt gain, classically pleuritic or exertional cp,  orthopnea pnd or arm/hand swelling  or leg swelling, presyncope, palpitations, abdominal pain, anorexia, nausea, vomiting, diarrhea  or change in bowel habits or change in bladder habits, change in stools or change in urine, dysuria, hematuria,  rash, arthralgias, visual complaints, headache, numbness, weakness or ataxia or problems with walking or coordination,  change in mood or  memory.        Current Meds  Medication Sig   albuterol (VENTOLIN HFA) 108 (90  Base) MCG/ACT inhaler USE 2 INHALATIONS 15 MINUTES APART EVERY 4 HOURS TO RESCUE ASTHMA   budesonide-formoterol (SYMBICORT) 80-4.5 MCG/ACT inhaler Take 2 puffs first thing in am and then another 2 puffs about 12 hours later.   cetirizine (ZYRTEC) 10 MG tablet Take 10 mg by mouth daily.   FLUoxetine (PROZAC) 20 MG capsule Take 1 capsule (20 mg total) by mouth daily.   fluticasone (FLONASE) 50 MCG/ACT nasal spray SPRAY 2 SPRAYS INTO EACH NOSTRIL EVERY DAY   hydrOXYzine (ATARAX) 25 MG tablet Take 2 tablets (50 mg total) by mouth 3 (three) times daily as needed for itching.   ipratropium-albuterol (DUONEB) 0.5-2.5 (3) MG/3ML SOLN Take  3 mLs by nebulization every 4 (four) hours as needed. Max:6 doses per day   levonorgestrel (MIRENA) 20 MCG/24HR IUD 1 each by Intrauterine route once.   montelukast (SINGULAIR) 10 MG tablet Take 1 tablet (10 mg total) by mouth daily.               Past Medical History:  Diagnosis Date   Allergy    Anxiety    Bronchitis    hx of   Depression    Heart murmur    born with a heart murmur; pt stated "I havent seen a doctor for it since I was a kid"   History of thyroid nodule 06/23/2015   Pneumonia    hx of   Thyroid nodule       Objective:    12/13/2022         174   11/29/22 175 lb (79.4 kg)  10/16/22 174 lb 3.2 oz (79 kg)  09/19/22 169 lb (76.7 kg)      Vital signs reviewed  12/13/2022  - Note at rest 02 sats  100% on RA   General appearance:    pleasant amb wf nad   HEENT : Oropharynx  clear     Nasal turbinates mild MP secretions/ edema s polyps   NECK :  without  apparent JVD/ palpable Nodes/TM    LUNGS: no acc muscle use,  Nl contour chest which is clear to A and P bilaterally without cough on insp or exp maneuvers   CV:  RRR  no s3 or murmur or increase in P2, and no edema   ABD:  soft and nontender    MS:  Nl gait/ ext warm without deformities Or obvious joint restrictions  calf tenderness, cyanosis or clubbing    SKIN: warm and dry without lesions    NEURO:  alert, approp, nl sensorium with  no motor or cerebellar deficits apparent.        Assessment

## 2022-12-23 ENCOUNTER — Other Ambulatory Visit: Payer: Self-pay

## 2022-12-23 DIAGNOSIS — U071 COVID-19: Secondary | ICD-10-CM

## 2022-12-23 DIAGNOSIS — F339 Major depressive disorder, recurrent, unspecified: Secondary | ICD-10-CM

## 2022-12-23 MED ORDER — FLUOXETINE HCL 20 MG PO CAPS: 20.0000 mg | ORAL_CAPSULE | Freq: Every day | ORAL | 3 refills | Status: DC

## 2022-12-23 MED ORDER — FLUOXETINE HCL 20 MG PO CAPS: 20.0000 mg | ORAL_CAPSULE | Freq: Every day | ORAL | 2 refills | Status: DC

## 2023-01-28 DIAGNOSIS — M67831 Other specified disorders of synovium, right wrist: Secondary | ICD-10-CM | POA: Diagnosis not present

## 2023-01-30 ENCOUNTER — Encounter: Payer: BC Managed Care – PPO | Admitting: Nurse Practitioner

## 2023-02-24 ENCOUNTER — Encounter: Payer: BC Managed Care – PPO | Admitting: Nurse Practitioner

## 2023-04-29 ENCOUNTER — Encounter: Payer: BC Managed Care – PPO | Admitting: Nurse Practitioner

## 2024-02-03 ENCOUNTER — Ambulatory Visit
Admission: RE | Admit: 2024-02-03 | Discharge: 2024-02-03 | Disposition: A | Discharge status: Home / Self Care | Source: Ambulatory Visit | Attending: Emergency Medicine | Admitting: Emergency Medicine

## 2024-02-03 VITALS — BP 113/77 | HR 74 | Temp 98.2°F | Resp 16

## 2024-02-03 DIAGNOSIS — J069 Acute upper respiratory infection, unspecified: Secondary | ICD-10-CM | POA: Diagnosis not present

## 2024-02-03 DIAGNOSIS — J4521 Mild intermittent asthma with (acute) exacerbation: Secondary | ICD-10-CM

## 2024-02-03 LAB — GROUP A STREP BY PCR: Group A Strep by PCR: NOT DETECTED

## 2024-02-03 MED ORDER — PREDNISONE 20 MG PO TABS: 60.0000 mg | ORAL_TABLET | Freq: Every day | ORAL | 0 refills | Status: AC

## 2024-02-03 MED ORDER — PROMETHAZINE-DM 6.25-15 MG/5ML PO SYRP: 5.0000 mL | ORAL_SOLUTION | Freq: Four times a day (QID) | ORAL | 0 refills | Status: DC | PRN

## 2024-02-03 MED ORDER — BENZONATATE 100 MG PO CAPS: 200.0000 mg | ORAL_CAPSULE | Freq: Three times a day (TID) | ORAL | 0 refills | Status: DC

## 2024-02-03 MED ORDER — IPRATROPIUM BROMIDE 0.06 % NA SOLN: 2.0000 | Freq: Four times a day (QID) | NASAL | 12 refills | Status: DC

## 2024-02-03 NOTE — Discharge Instructions (Signed)
 Your strep test today was negative but your physical exam is consistent with a viral respiratory infection.  Continue to use your albuterol  inhaler as needed for any shortness of breath or wheezing.  You may also use over-the-counter Tylenol  and/or ibuprofen Corda pack instructions as needed for any fever or pain.  If you develop any worsening shortness of breath or wheezing start taking the prednisone  that I prescribed.  You will take 60 mg daily for 5 days.  Use the Atrovent nasal spray, 2 squirts in each nostril every 6 hours, as needed for runny nose and postnasal drip.  Use the Tessalon  Perles every 8 hours during the day.  Take them with a small sip of water.  They may give you some numbness to the base of your tongue or a metallic taste in your mouth, this is normal.  Use the Promethazine  DM cough syrup at bedtime for cough and congestion.  It will make you drowsy so do not take it during the day.  Return for reevaluation or see your primary care provider for any new or worsening symptoms.

## 2024-02-03 NOTE — ED Provider Notes (Signed)
 [facility]    CSN: [id number] Arrival date & time: 02/03/24  1104      History   Chief Complaint Chief Complaint  Patient presents with   Sore Throat    Entered by patient   Ear Fullness   Fatigue    HPI Jennifer Ponce is a 43 y.o. female.   HPI  43 year old female with past medical history significant for asthma, depression, anxiety, thyroid  nodule, heart murmur, and allergies presents for evaluation of respiratory symptoms that began 4 days ago that include fatigue, body aches, runny nose nasal congestion, bilateral ear fullness, sore throat, nonproductive cough, and shortness of breath.  She denies wheezing or GI complaints.  She has been using her inhaler intermittently for her asthma and reports that it is helping with her symptoms.  She has taken a home COVID and flu test which was negative.  Patient is requesting a refill of her albuterol  as her PCP recently passed away.  She also needs a new PCP.  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      Home Medications    Prior to Admission medications   Medication Sig Start Date End Date Taking? Authorizing Provider  benzonatate  (TESSALON ) 100 MG capsule Take 2  capsules (200 mg total) by mouth every 8 (eight) hours. 02/03/24  Yes Kent Pear, NP  budesonide -formoterol  (SYMBICORT ) 80-4.5 MCG/ACT inhaler Take 2 puffs first thing in am and then another 2 puffs about 12 hours later. 10/16/22  Yes Diamond Formica, MD  ipratropium (ATROVENT) 0.06 % nasal spray Place 2 sprays into both nostrils 4 (four) times daily. 02/03/24  Yes Kent Pear, NP  predniSONE  (DELTASONE ) 20 MG tablet Take 3 tablets (60 mg total) by mouth daily with breakfast for 5 days. 3 tablets daily for 5 days. 02/03/24 02/08/24 Yes Kent Pear, NP  promethazine -dextromethorphan (PROMETHAZINE -DM) 6.25-15 MG/5ML syrup Take 5 mLs by mouth 4 (four) times daily as needed. 02/03/24  Yes Kent Pear, NP  albuterol  (VENTOLIN  HFA) 108 (90 Base) MCG/ACT inhaler USE 2 INHALATIONS 15 MINUTES APART EVERY 4 HOURS TO RESCUE ASTHMA 10/14/22   Wilkinson, Dana E, FNP  cetirizine (ZYRTEC) 10 MG tablet Take 10 mg by mouth daily.    [provider]  FLUoxetine  (PROZAC ) 20 MG capsule Take 1 capsule (20 mg total) by mouth daily. 12/23/22 12/23/23  Wilkinson, Dana E, FNP  fluticasone  (FLONASE ) 50 MCG/ACT nasal spray SPRAY 2 SPRAYS INTO EACH NOSTRIL EVERY DAY 07/03/22   Farris Hong, PA-C  hydrOXYzine  (ATARAX ) 25 MG tablet Take 2 tablets (50 mg total) by mouth 3 (  three) times daily as needed for itching. 11/29/22   Wilkinson, Dana E, FNP  ipratropium-albuterol  (DUONEB) 0.5-2.5 (3) MG/3ML SOLN Take 3 mLs by nebulization every 4 (four) hours as needed. Max:6 doses per day 09/10/22   Wilkinson, Dana E, FNP  levonorgestrel  (MIRENA ) 20 MCG/24HR IUD 1 each by Intrauterine route once.    [provider]  montelukast  (SINGULAIR ) 10 MG tablet Take 1 tablet (10 mg total) by mouth daily. 10/14/22 10/14/23  Stephane Ee, FNP    Family History Family History  Problem Relation Age of Onset   Heart disease Maternal Grandmother    Brain cancer Maternal Grandfather    Breast cancer Maternal Aunt     Social  History Social History   Tobacco Use   Smoking status: Former    Current packs/day: 0.00    Average packs/day: 0.5 packs/day for 11.0 years (5.5 ttl pk-yrs)    Types: Cigarettes    Start date: 07/26/1996    Quit date: 07/27/2007    Years since quitting: 16.5   Smokeless tobacco: Never  Substance Use Topics   Alcohol use: Yes    Comment: occasionally   Drug use: No     Allergies   Patient has no known allergies.   Review of Systems Review of Systems  Constitutional:  Positive for fatigue. Negative for fever.  HENT:  Positive for congestion, ear pain, rhinorrhea and sore throat.   Respiratory:  Positive for cough and shortness of breath. Negative for wheezing.   Gastrointestinal:  Negative for diarrhea, nausea and vomiting.  Musculoskeletal:  Positive for arthralgias and myalgias.     Physical Exam Triage Vital Signs ED Triage Vitals  Encounter Vitals Group     BP      Systolic BP Percentile      Diastolic BP Percentile      Pulse      Resp      Temp      Temp src      SpO2      Weight      Height      Head Circumference      Peak Flow      Pain Score      Pain Loc      Pain Education      Exclude from Growth Chart    No data found.  Updated Vital Signs BP 113/77 (BP Location: Right Arm)   Pulse 74   Temp 98.2 F (36.8 C) (Oral)   Resp 16   SpO2 97%   Visual Acuity Right Eye Distance:   Left Eye Distance:   Bilateral Distance:    Right Eye Near:   Left Eye Near:    Bilateral Near:     Physical Exam Vitals and nursing note reviewed.  Constitutional:      Appearance: Normal appearance. She is not ill-appearing.  HENT:     Head: Normocephalic and atraumatic.     Right Ear: Tympanic membrane, ear canal and external ear normal. There is no impacted cerumen.     Left Ear: Tympanic membrane, ear canal and external ear normal. There is no impacted cerumen.     Nose: Congestion and rhinorrhea present.     Comments: Mucosa is edematous and  erythematous with clear discharge in both nares.    Mouth/Throat:     Mouth: Mucous membranes are moist.     Pharynx: Oropharynx is clear. Posterior oropharyngeal erythema present. No oropharyngeal exudate.     Comments: Tonsillar pillars  are unremarkable.  Posterior pharynx demonstrates erythema and injection with clear postnasal drip. Neck:     Comments: Bilateral anterior, nontender, cervical lymphadenopathy present. Cardiovascular:     Rate and Rhythm: Normal rate and regular rhythm.     Pulses: Normal pulses.     Heart sounds: Normal heart sounds. No murmur heard.    No friction rub. No gallop.  Pulmonary:     Effort: Pulmonary effort is normal.     Breath sounds: Wheezing present. No rhonchi or rales.     Comments: Scattered wheezes at end of inspiration. Musculoskeletal:     Cervical back: Normal range of motion and neck supple. No tenderness.  Lymphadenopathy:     Cervical: Cervical adenopathy present.  Skin:    General: Skin is warm and dry.     Capillary Refill: Capillary refill takes less than 2 seconds.     Findings: No rash.  Neurological:     General: No focal deficit present.     Mental Status: She is oriented to person, place, and time.      UC Treatments / Results  Labs (all labs ordered are listed, but only abnormal results are displayed) Labs Reviewed  GROUP A STREP BY PCR    EKG   Radiology No results found.  Procedures Procedures (including critical care time)  Medications Ordered in UC Medications - No data to display  Initial Impression / Assessment and Plan / UC Course  I have reviewed the triage vital signs and the nursing notes.  Pertinent labs & imaging results that were available during my care of the patient were reviewed by me and considered in my medical decision making (see chart for details).   Patient is a pleasant, nontoxic-appearing 43 year old female presenting for evaluation of respiratory symptoms as outlined HPI above.   She does have a history of asthma and has been using her albuterol  inhaler.  She is able to speak in full sentences but dyspnea or tachypnea.  Respiratory rate at triage was 16 with a room air oxygen saturation of 97%.  She is moving good air but does have scattered wheezes at end of inspiration.  She has taken a home COVID and flu test that was negative.  Her most pressing symptom is a sore throat.  I will order a strep PCR.  Strep PCR is negative.  I will discharge patient on the diagnosis of viral URI with a cough with prescription for Atrovent nasal spray to help with her nasal congestion.  Tessalon  Perles and Promethazine  DM cough syrup for cough and congestion.  She may use over-the-counter Tylenol  and/or ibuprofen according to the package instructions as needed for any fever or pain.  Return precautions reviewed.  Work note provided.  I have also written a prescription for prednisone  should she develop worsening shortness of breath or wheezing.   Final Clinical Impressions(s) / UC Diagnoses   Final diagnoses:  Viral URI with cough  Mild intermittent asthma with acute exacerbation     Discharge Instructions      Your strep test today was negative but your physical exam is consistent with a viral respiratory infection.  Continue to use your albuterol  inhaler as needed for any shortness of breath or wheezing.  You may also use over-the-counter Tylenol  and/or ibuprofen Corda pack instructions as needed for any fever or pain.  If you develop any worsening shortness of breath or wheezing start taking the prednisone  that I prescribed.  You will take 60 mg daily for  5 days.  Use the Atrovent nasal spray, 2 squirts in each nostril every 6 hours, as needed for runny nose and postnasal drip.  Use the Tessalon  Perles every 8 hours during the day.  Take them with a small sip of water.  They may give you some numbness to the base of your tongue or a metallic taste in your mouth, this is  normal.  Use the Promethazine  DM cough syrup at bedtime for cough and congestion.  It will make you drowsy so do not take it during the day.  Return for reevaluation or see your primary care provider for any new or worsening symptoms.      ED Prescriptions     Medication Sig Dispense Auth. Provider   benzonatate  (TESSALON ) 100 MG capsule Take 2 capsules (200 mg total) by mouth every 8 (eight) hours. 21 capsule Kent Pear, NP   ipratropium (ATROVENT) 0.06 % nasal spray Place 2 sprays into both nostrils 4 (four) times daily. 15 mL Kent Pear, NP   promethazine -dextromethorphan (PROMETHAZINE -DM) 6.25-15 MG/5ML syrup Take 5 mLs by mouth 4 (four) times daily as needed. 118 mL Kent Pear, NP   predniSONE  (DELTASONE ) 20 MG tablet Take 3 tablets (60 mg total) by mouth daily with breakfast for 5 days. 3 tablets daily for 5 days. 15 tablet Kent Pear, NP      PDMP not reviewed this encounter.   Kent Pear, NP 02/03/24 1204

## 2024-02-03 NOTE — ED Triage Notes (Signed)
 Sx x 4 days  Bilateral ear fullness Sore throat Fatigue bodyaches   At home covid/flu that was neg.

## 2024-02-20 ENCOUNTER — Ambulatory Visit: Admitting: Physician Assistant

## 2024-03-26 ENCOUNTER — Other Ambulatory Visit: Payer: Self-pay | Admitting: Internal Medicine

## 2024-04-02 ENCOUNTER — Other Ambulatory Visit: Payer: Self-pay | Admitting: Internal Medicine

## 2024-04-02 DIAGNOSIS — U071 COVID-19: Secondary | ICD-10-CM

## 2024-04-02 NOTE — Telephone Encounter (Signed)
 Copied from CRM [id number]. Topic: Clinical - Medication Refill >> Apr 02, 2024 12:16 PM Corean SAUNDERS wrote: Medication: albuterol  (VENTOLIN  HFA) 108 (90 Base) MCG/ACT inhaler   Has the patient contacted their pharmacy? Needs to order to a new pharmacy. (Agent: If no, request that the patient contact the pharmacy for the refill. If patient does not wish to contact the pharmacy document the reason why and proceed with request.) (Agent: If yes, when and what did the pharmacy advise?)  This is the patient's preferred pharmacy:  [id number] [place],  - [address] AT [facility] OF [address] [place] [address] [place] [address] Phone: [phone] Fax: [phone]   Is this the correct pharmacy for this prescription? Yes If no, delete pharmacy and type the correct one.   Has the prescription been filled recently? No  Is the patient out of the medication? Yes  Has the patient been seen for an appointment in the last year OR does the patient have an upcoming appointment? Yes  Can we respond through MyChart? No   Agent: Please be advised that Rx refills may take up to 3 business days. We ask that you follow-up with your pharmacy.

## 2024-04-02 NOTE — Telephone Encounter (Signed)
 Patient states that her Albuterol  inhaler was prescribed by Dr Tonita who passed away and their office is completely shut down at this time. She also was checking on her Symbicort  refill request. Patient is advised that if she has any symptoms that Urgent Care and the Emergency Room are there if needed. Patient verbalized understanding.

## 2024-04-05 MED ORDER — ALBUTEROL SULFATE HFA 108 (90 BASE) MCG/ACT IN AERS: INHALATION_SPRAY | RESPIRATORY_TRACT | 0 refills | Status: DC

## 2024-04-12 NOTE — Progress Notes (Deleted)
 Jennifer Ponce, female    DOB: [birth year]   MRN: [id number]   Brief patient profile:  42 asthma prev seen by Dr Neysa and  referred back to pulmonary clinic 10/16/2022 by Tonita for asthma previously on Breo.      History of Present Illness  10/16/2022  Pulmonary/ 1st office eval/Hildegard Hlavac only maint rx =  singulair  with doe x years  Chief Complaint  Patient presents with   Consult    Asthma has gotten worse x 1 year.  Cough, wheeze and SOB flares more frequent.  ACT is 15  Dyspnea:  fast walk several hundred feet esp heat  Cough: none/some nasal congestion  Sleep: maybe once a week wakes up and uses albuterol / neb when sick  SABA use: last used one week prior to OV  Rec Plan A = Automatic = Always=    Symbicort  80 (or Dulera 100) Take 2 puffs first thing in am and then another 2 puffs about 12 hours later.  Work on inhaler technique:   Plan B = Backup (to supplement plan A, not to replace it) Only use your albuterol  inhaler as a rescue medication  Plan C = Crisis (instead of Plan B but only if Plan B stops working) - only use your albuterol  nebulizer if you first try Plan B  Labs Allergy screen 10/16/2022 >  Eos 0. 3/  IgE  p166   Please schedule a follow up visit in 3 months but call sooner if needed  with all medications /inhalers/ solutions in hand     12/13/2022  f/u ov/Marquist Binstock/  Clinic re: asthma maint on symbicort  80 /singulair   Chief Complaint  Patient presents with   Follow-up    SOB with exertion. No wheezing or cough.   Dyspnea:  only with jogging  Cough: sense of pnds Sleeping: no resp cc  SABA use: only p exercise  02: none  Rec Also  Ok to try albuterol  15 min before an activity (on alternating days)  that you know would usually make you short of breath a   Work on inhaler technique:   04/14/2024  f/u ov/Marqueta Pulley re: asthma    maint on ***  No chief complaint on file.   Dyspnea:  *** Cough: *** Sleeping: *** resp cc  SABA use: *** 02: ***  Lung  cancer screening :  ***    No obvious day to day or daytime variability or assoc excess/ purulent sputum or mucus plugs or hemoptysis or cp or chest tightness, subjective wheeze or overt sinus or hb symptoms.    Also denies any obvious fluctuation of symptoms with weather or environmental changes or other aggravating or alleviating factors except as outlined above   No unusual exposure hx or h/o childhood pna/ asthma or knowledge of premature birth.  Current Allergies, Complete Past Medical History, Past Surgical History, Family History, and Social History were reviewed in [employer] record.  ROS  The following are not active complaints unless bolded Hoarseness, sore throat, dysphagia, dental problems, itching, sneezing,  nasal congestion or discharge of excess mucus or purulent secretions, ear ache,   fever, chills, sweats, unintended wt loss or wt gain, classically pleuritic or exertional cp,  orthopnea pnd or arm/hand swelling  or leg swelling, presyncope, palpitations, abdominal pain, anorexia, nausea, vomiting, diarrhea  or change in bowel habits or change in bladder habits, change in stools or change in urine, dysuria, hematuria,  rash, arthralgias, visual complaints, headache, numbness, weakness or ataxia  or problems with walking or coordination,  change in mood or  memory.        No outpatient medications have been marked as taking for the 04/14/24 encounter (Appointment) with Abbigael Detlefsen B, MD.               Past Medical History:  Diagnosis Date   Allergy    Anxiety    Bronchitis    hx of   Depression    Heart murmur    born with a heart murmur; pt stated I havent seen a doctor for it since I was a kid   History of thyroid  nodule 06/23/2015   Pneumonia    hx of   Thyroid  nodule       Objective:    Wts   04/14/2024           ***  12/13/2022         174   11/29/22 175 lb (79.4 kg)  10/16/22 174 lb 3.2 oz (79 kg)  09/19/22 169 lb (76.7 kg)      Vital signs reviewed  04/14/2024  - Note at rest 02 sats  ***% on ***   General appearance:    ***       Assessment

## 2024-04-14 ENCOUNTER — Ambulatory Visit: Admitting: Internal Medicine

## 2024-04-18 ENCOUNTER — Other Ambulatory Visit: Payer: Self-pay | Admitting: Internal Medicine

## 2024-04-18 DIAGNOSIS — U071 COVID-19: Secondary | ICD-10-CM

## 2024-04-28 ENCOUNTER — Encounter: Payer: BC Managed Care – PPO | Admitting: Nurse Practitioner

## 2024-05-21 ENCOUNTER — Other Ambulatory Visit: Payer: Self-pay | Admitting: Obstetrics and Gynecology

## 2024-05-21 DIAGNOSIS — N6320 Unspecified lump in the left breast, unspecified quadrant: Secondary | ICD-10-CM

## 2024-05-25 ENCOUNTER — Ambulatory Visit
Admission: RE | Admit: 2024-05-25 | Discharge: 2024-05-25 | Disposition: A | Discharge status: Home / Self Care | Source: Ambulatory Visit | Attending: Obstetrics and Gynecology | Admitting: Obstetrics and Gynecology

## 2024-05-25 DIAGNOSIS — N6320 Unspecified lump in the left breast, unspecified quadrant: Secondary | ICD-10-CM

## 2024-05-26 ENCOUNTER — Other Ambulatory Visit: Payer: Self-pay | Admitting: Obstetrics and Gynecology

## 2024-05-26 DIAGNOSIS — R921 Mammographic calcification found on diagnostic imaging of breast: Secondary | ICD-10-CM

## 2024-05-26 DIAGNOSIS — N6320 Unspecified lump in the left breast, unspecified quadrant: Secondary | ICD-10-CM

## 2024-05-31 ENCOUNTER — Ambulatory Visit
Admission: RE | Admit: 2024-05-31 | Discharge: 2024-05-31 | Disposition: A | Discharge status: Home / Self Care | Source: Ambulatory Visit | Attending: Obstetrics and Gynecology | Admitting: Obstetrics and Gynecology

## 2024-05-31 DIAGNOSIS — N6320 Unspecified lump in the left breast, unspecified quadrant: Secondary | ICD-10-CM

## 2024-05-31 DIAGNOSIS — R921 Mammographic calcification found on diagnostic imaging of breast: Secondary | ICD-10-CM

## 2024-05-31 HISTORY — PX: BREAST BIOPSY: SHX20

## 2024-06-01 LAB — SURGICAL PATHOLOGY

## 2024-06-02 ENCOUNTER — Other Ambulatory Visit: Payer: Self-pay | Admitting: Internal Medicine

## 2024-06-07 NOTE — Progress Notes (Unsigned)
 Jennifer Ponce, female    DOB: August 14, 1981   MRN: [id number]   Brief patient profile:  42 yowf  quit smoking 2008 with with seasonal rhinitis/sinus infections/pna starting in her 109s  initially rx zyrtec then around 2014  more wheeze/ sob dx as asthma by Dr Neysa and  referred back to pulmonary clinic 10/16/2022 by Tonita for asthma previously on Breo.      History of Present Illness  10/16/2022  Pulmonary/ 1st office eval/Rudolph Daoust only maint rx =  singulair  with doe x years  Chief Complaint  Patient presents with   Consult    Asthma has gotten worse x 1 year.  Cough, wheeze and SOB flares more frequent.  ACT is 15  Dyspnea:  fast walk several hundred feet esp heat  Cough: none/some nasal congestion  Sleep: maybe once a week wakes up and uses albuterol / neb when sick  SABA use: last used one week prior to OV  Rec Plan A = Automatic = Always=    Symbicort  80 (or Dulera 100) Take 2 puffs first thing in am and then another 2 puffs about 12 hours later.  Work on inhaler technique:   Plan B = Backup (to supplement plan A, not to replace it) Only use your albuterol  inhaler as a rescue medication  Plan C = Crisis (instead of Plan B but only if Plan B stops working) - only use your albuterol  nebulizer if you first try Plan B  Labs Allergy screen 10/16/2022 >  Eos 0. 3/  IgE  166   Please schedule a follow up visit in 3 months but call sooner if needed  with all medications /inhalers/ solutions in hand     12/13/2022  f/u ov/Jaythen Hamme/ [facility] re: asthma  maint on symbicort  80 /singulair   Chief Complaint  Patient presents with   Follow-up    SOB with exertion. No wheezing or cough.   Dyspnea:  only with jogging  Cough: sense of pnds Sleeping: no resp cc  SABA use: only p exercise  02: none  Rec Also  Ok to try albuterol  15 min before an activity (on alternating days)  that you know would usually make you short of breath   Work on inhaler technique:       06/09/2024  f/u ov/Rocklin Soderquist  re: asthma since about 2014   maint on symbicort   80 2bid   Chief Complaint  Patient presents with   Follow-up    Doing well.  Symbicort  working well.  Clearing throat more frequently  Dyspnea:  Not limited by breathing from desired activities   Cough: nonproductive/daytime/ globus x years  Sleeping: flat flat/ one pillow s  resp cc  SABA use: none  02: none     No obvious day to day or daytime variability or assoc excess/ purulent sputum or mucus plugs or hemoptysis or cp or chest tightness, subjective wheeze or overt sinus or hb symptoms.    Also denies any obvious fluctuation of symptoms with weather or environmental changes or other aggravating or alleviating factors except as outlined above   No unusual exposure hx or h/o childhood pna/ asthma or knowledge of premature birth.  Current Allergies, Complete Past Medical History, Past Surgical History, Family History, and Social History were reviewed in [employer] record.  ROS  The following are not active complaints unless bolded Hoarseness, sore throat(mild globus) , dysphagia, dental problems, itching, sneezing,  nasal congestion or discharge of excess mucus or purulent secretions,  ear ache,   fever, chills, sweats, unintended wt loss or wt gain, classically pleuritic or exertional cp,  orthopnea pnd or arm/hand swelling  or leg swelling, presyncope, palpitations, abdominal pain, anorexia, nausea, vomiting, diarrhea  or change in bowel habits or change in bladder habits, change in stools or change in urine, dysuria, hematuria,  rash, arthralgias, visual complaints, headache, numbness, weakness or ataxia or problems with walking or coordination,  change in mood or  memory.        Current Meds  Medication Sig   albuterol  (VENTOLIN  HFA) 108 (90 Base) MCG/ACT inhaler INHALE 2 PUFFS BY MOUTH 15 MINUTES APART EVERY 4 HOURS TO RESCUE ASTHMA AS DIRECTED   budesonide -formoterol  (SYMBICORT ) 80-4.5 MCG/ACT inhaler INHALE 2  PUFFS BY MOUTH FRIST THING IN THE MORNING AND THEN ANOTHER 2 PUFFS ABOUT 12 HOURS LATER   cetirizine (ZYRTEC) 10 MG tablet Take 10 mg by mouth daily.   fluticasone  (FLONASE ) 50 MCG/ACT nasal spray SPRAY 2 SPRAYS INTO EACH NOSTRIL EVERY DAY   ipratropium-albuterol  (DUONEB) 0.5-2.5 (3) MG/3ML SOLN Take 3 mLs by nebulization every 4 (four) hours as needed. Max:6 doses per day   levonorgestrel  (MIRENA ) 20 MCG/24HR IUD 1 each by Intrauterine route once.              Past Medical History:  Diagnosis Date   Allergy    Anxiety    Bronchitis    hx of   Depression    Heart murmur    born with a heart murmur; pt stated I havent seen a doctor for it since I was a kid   History of thyroid  nodule 06/23/2015   Pneumonia    hx of   Thyroid  nodule       Objective:    Wts  06/09/2024          191   12/13/2022         174   11/29/22 175 lb (79.4 kg)  10/16/22 174 lb 3.2 oz (79 kg)  09/19/22 169 lb (76.7 kg)    Vital signs reviewed  06/09/2024  - Note at rest 02 sats  100% on RA   General appearance:    amb pleasant wf / occ throat clearling       HEENT : Oropharynx  clear s cobblestoning or excess pnd    Nasal turbinates mod edema/ non-specific   NECK :  without  apparent JVD/ palpable Nodes/TM    LUNGS: no acc muscle use,  Nl contour chest which is clear to A and P bilaterally without cough on insp or exp maneuvers   CV:  RRR  no s3 or murmur or increase in P2, and no edema   ABD:  soft and nontender   MS:  Gait nl   ext warm without deformities Or obvious joint restrictions  calf tenderness, cyanosis or clubbing    SKIN: warm and dry without lesions    NEURO:  alert, approp, nl sensorium with  no motor or cerebellar deficits apparent.           Assessment   Assessment & Plan Uncomplicated asthma, unspecified asthma severity, unspecified whether persistent Onset ? 2014 in setting of seasonal rhinitis since childhood/ quit smoking 2008 PFT: 08/06/2013-within normal limits  s prior rx:  FEV1 4.03/121%, FEV1/FVC 0.85, 7% better p saba - 10/16/2022 rx symbicort  80 2bid  x better for a week then taper to 2 each am  - FENO  10/16/2022   = 223  - Allergy  screen 10/16/2022 >  Eos 0. 3/  IgE  166  - FENO  12/13/2022  = 49 on symb 80 2bid/singulair  with sense of pnds and doe x jogging only  - 12/13/2022  After extensive coaching inhaler device,  effectiveness =    80%   >>>  06/09/2024  After extensive coaching inhaler device,  effectiveness =    80% (late trigger)> try symb 80 one bid due to throat tickle and if persists add spacer   All goals of chronic asthma control met including optimal function and elimination of symptoms with minimal need for rescue therapy.  Contingencies discussed in full including contacting this office immediately if not controlling the symptoms using the rule of two's.     The throat  tickle is likely from pnds/ gerd diet  (using mints) or the effects of ICS on the upper airway but is more of a nuisance than a threat to her health so will start with the above measures with option for frmal allergy eval as well for the pnds and in meantime just use the zyrtec /flonase  prn   F/u can be q 12 m, sooner prn      Each maintenance medication was reviewed in detail including emphasizing most importantly the difference between maintenance and prns and under what circumstances the prns are to be triggered using an action plan format where appropriate.  Total time for H and P, chart review, counseling, reviewing hfa/ spacer  device(s) and generating customized AVS unique to this office visit / same day charting = 30 min summary f/u ov            AVS  Patient Instructions  GERD (REFLUX)  is an extremely common cause of respiratory symptoms just like yours , many times with no obvious heartburn at all.    It can be treated with medication, but also with lifestyle changes including elevation of the head of your bed (ideally with 6 -8inch blocks under the  headboard of your bed),  Smoking cessation, avoidance of late meals, excessive alcohol, and avoid fatty foods, chocolate, peppermint, colas, red wine, and acidic juices such as orange juice and cut way back on coffee  NO MINT OR MENTHOL PRODUCTS SO NO COUGH DROPS - Luden's (pectin based)  USE SUGARLESS CANDY INSTEAD (Jolley ranchers or Stover's or Environmental manager) or even ice chips will also do - the key is to swallow to prevent all throat clearing. NO OIL BASED VITAMINS - use powdered substitutes.  Avoid fish oil when coughing.    Ok to try symbicort  80 one twice daily and add  spacer if needed   Work on inhaler technique:  relax and gently blow all the way out then take a nice smooth full deep breath back in, triggering the inhaler at same time you start breathing in.  Hold breath in for at least  5 seconds if you can. Blow out symbicort   thru nose. Rinse and gargle with water when done.  If mouth or throat bother you at all,  try brushing teeth/gums/tongue with arm and hammer toothpaste/ make a slurry and gargle and spit out.      Please schedule a follow up visit in 12  months but call sooner if needed        Ozell America, MD 06/09/2024

## 2024-06-09 ENCOUNTER — Ambulatory Visit (INDEPENDENT_AMBULATORY_CARE_PROVIDER_SITE_OTHER): Admitting: Internal Medicine

## 2024-06-09 ENCOUNTER — Encounter: Payer: Self-pay | Admitting: Internal Medicine

## 2024-06-09 VITALS — BP 108/76 | HR 71 | Temp 98.8°F | Ht 67.0 in | Wt 191.4 lb

## 2024-06-09 DIAGNOSIS — J45909 Unspecified asthma, uncomplicated: Secondary | ICD-10-CM

## 2024-06-09 NOTE — Assessment & Plan Note (Addendum)
 Onset ? 2014 in setting of seasonal rhinitis since childhood/ quit smoking 2008 PFT: 08/06/2013-within normal limits s prior rx:  FEV1 4.03/121%, FEV1/FVC 0.85, 7% better p saba - 10/16/2022 rx symbicort  80 2bid  x better for a week then taper to 2 each am  - FENO  10/16/2022   = 223  - Allergy screen 10/16/2022 >  Eos 0. 3/  IgE  166  - FENO  12/13/2022  = 49 on symb 80 2bid/singulair  with sense of pnds and doe x jogging only  - 12/13/2022  After extensive coaching inhaler device,  effectiveness =    80%   >>>  06/09/2024  After extensive coaching inhaler device,  effectiveness =    80% (late trigger)> try symb 80 one bid due to throat tickle and if persists add spacer   All goals of chronic asthma control met including optimal function and elimination of symptoms with minimal need for rescue therapy.  Contingencies discussed in full including contacting this office immediately if not controlling the symptoms using the rule of two's.     The throat  tickle is likely from pnds/ gerd diet  (using mints) or the effects of ICS on the upper airway but is more of a nuisance than a threat to her health so will start with the above measures with option for frmal allergy eval as well for the pnds and in meantime just use the zyrtec /flonase  prn   F/u can be q 12 m, sooner prn      Each maintenance medication was reviewed in detail including emphasizing most importantly the difference between maintenance and prns and under what circumstances the prns are to be triggered using an action plan format where appropriate.  Total time for H and P, chart review, counseling, reviewing hfa/ spacer  device(s) and generating customized AVS unique to this office visit / same day charting = 30 min summary f/u ov

## 2024-06-09 NOTE — Patient Instructions (Addendum)
 GERD (REFLUX)  is an extremely common cause of respiratory symptoms just like yours , many times with no obvious heartburn at all.    It can be treated with medication, but also with lifestyle changes including elevation of the head of your bed (ideally with 6 -8inch blocks under the headboard of your bed),  Smoking cessation, avoidance of late meals, excessive alcohol, and avoid fatty foods, chocolate, peppermint, colas, red wine, and acidic juices such as orange juice and cut way back on coffee  NO MINT OR MENTHOL PRODUCTS SO NO COUGH DROPS - Luden's (pectin based)  USE SUGARLESS CANDY INSTEAD (Jolley ranchers or Stover's or Environmental manager) or even ice chips will also do - the key is to swallow to prevent all throat clearing. NO OIL BASED VITAMINS - use powdered substitutes.  Avoid fish oil when coughing.    Ok to try symbicort  80 one twice daily and add  spacer if needed   Work on inhaler technique:  relax and gently blow all the way out then take a nice smooth full deep breath back in, triggering the inhaler at same time you start breathing in.  Hold breath in for at least  5 seconds if you can. Blow out symbicort   thru nose. Rinse and gargle with water when done.  If mouth or throat bother you at all,  try brushing teeth/gums/tongue with arm and hammer toothpaste/ make a slurry and gargle and spit out.      Please schedule a follow up visit in 12  months but call sooner if needed

## 2024-07-09 ENCOUNTER — Other Ambulatory Visit: Payer: Self-pay | Admitting: Internal Medicine

## 2024-07-27 ENCOUNTER — Encounter: Payer: Self-pay | Admitting: Internal Medicine

## 2024-07-27 MED ORDER — BUDESONIDE-FORMOTEROL FUMARATE 80-4.5 MCG/ACT IN AERO: INHALATION_SPRAY | RESPIRATORY_TRACT | 3 refills | Status: AC
